# Patient Record
Sex: Female | Born: 1995
Health system: Southern US, Community
[De-identification: ages and names within clinical notes are randomized; demographics above are authoritative.]

## PROBLEM LIST (undated history)

## (undated) DIAGNOSIS — N63 Unspecified lump in unspecified breast: Secondary | ICD-10-CM

## (undated) DIAGNOSIS — L309 Dermatitis, unspecified: Secondary | ICD-10-CM

## (undated) DIAGNOSIS — Z9109 Other allergy status, other than to drugs and biological substances: Secondary | ICD-10-CM

## (undated) DIAGNOSIS — J45909 Unspecified asthma, uncomplicated: Secondary | ICD-10-CM

## (undated) HISTORY — DX: Unspecified asthma, uncomplicated: J45.909

## (undated) HISTORY — DX: Other allergy status, other than to drugs and biological substances: Z91.09

---
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
## 2015-01-28 ENCOUNTER — Encounter: Payer: Self-pay | Admitting: Internal Medicine

## 2015-01-28 ENCOUNTER — Ambulatory Visit (INDEPENDENT_AMBULATORY_CARE_PROVIDER_SITE_OTHER): Payer: 59 | Admitting: Internal Medicine

## 2015-01-28 VITALS — BP 116/76 | HR 82 | Temp 97.8°F | Resp 18 | Ht 67.0 in | Wt 141.0 lb

## 2015-01-28 DIAGNOSIS — Z1329 Encounter for screening for other suspected endocrine disorder: Secondary | ICD-10-CM

## 2015-01-28 DIAGNOSIS — Z13220 Encounter for screening for lipoid disorders: Secondary | ICD-10-CM

## 2015-01-28 DIAGNOSIS — Z130 Encounter for screening for diseases of the blood and blood-forming organs and certain disorders involving the immune mechanism: Secondary | ICD-10-CM

## 2015-01-28 DIAGNOSIS — E559 Vitamin D deficiency, unspecified: Secondary | ICD-10-CM

## 2015-01-28 DIAGNOSIS — Z0000 Encounter for general adult medical examination without abnormal findings: Secondary | ICD-10-CM | POA: Diagnosis not present

## 2015-01-28 DIAGNOSIS — Z1389 Encounter for screening for other disorder: Secondary | ICD-10-CM

## 2015-01-28 DIAGNOSIS — Z131 Encounter for screening for diabetes mellitus: Secondary | ICD-10-CM

## 2015-01-28 DIAGNOSIS — Z79899 Other long term (current) drug therapy: Secondary | ICD-10-CM

## 2015-01-28 DIAGNOSIS — N63 Unspecified lump in unspecified breast: Secondary | ICD-10-CM

## 2015-01-28 LAB — CBC WITH DIFFERENTIAL/PLATELET
Basophils Absolute: 0 10*3/uL (ref 0.0–0.1)
Basophils Relative: 0 % (ref 0–1)
Eosinophils Absolute: 0.8 10*3/uL — ABNORMAL HIGH (ref 0.0–0.7)
Eosinophils Relative: 12 % — ABNORMAL HIGH (ref 0–5)
HEMATOCRIT: 40.3 % (ref 36.0–46.0)
HEMOGLOBIN: 13.6 g/dL (ref 12.0–15.0)
LYMPHS ABS: 2 10*3/uL (ref 0.7–4.0)
Lymphocytes Relative: 29 % (ref 12–46)
MCH: 29 pg (ref 26.0–34.0)
MCHC: 33.7 g/dL (ref 30.0–36.0)
MCV: 85.9 fL (ref 78.0–100.0)
MONO ABS: 0.4 10*3/uL (ref 0.1–1.0)
MONOS PCT: 6 % (ref 3–12)
MPV: 11.2 fL (ref 8.6–12.4)
NEUTROS ABS: 3.7 10*3/uL (ref 1.7–7.7)
NEUTROS PCT: 53 % (ref 43–77)
Platelets: 277 10*3/uL (ref 150–400)
RBC: 4.69 MIL/uL (ref 3.87–5.11)
RDW: 14.1 % (ref 11.5–15.5)
WBC: 7 10*3/uL (ref 4.0–10.5)

## 2015-01-28 NOTE — Progress Notes (Signed)
Patient ID: Annette Zuniga, female   DOB: [birth year], 20 y.o.   MRN: [id number]    Annual Screening    This very nice 20 y.o.female presents for complete physical.  Patient has no major health issues.  Patient reports no complaints at this time.   Patient presents to the office to discuss a new breast mass that has been in her left breast x 1 year.  She reports that the mass can be painful during her period.  She reports that other times it is not painful.  She has never noticed any other spots in her breasts.    She reports that she started her period at age 20.  Periods are regular.  She has never taken any birth control pills or shots.  She is currently sexually active.  She uses condoms as birth control.    Finally, patient has history of Vitamin D Deficiency and last vitamin D was No results found for: VD25OH.  Currently on supplementation.  Patient reports that she does have some social anxiety and she feels like that makes it hard to talk to people.  She does find that she gets emotional but she does not have mood swings.  She does concentrate well in class.       No current outpatient prescriptions on file prior to visit.   No current facility-administered medications on file prior to visit.    Allergies not on file  No past medical history on file.   There is no immunization history on file for this patient.  No past surgical history on file.  No family history on file.  Social History   Social History  . Marital Status: Single    Spouse Name: N/A  . Number of Children: N/A  . Years of Education: N/A   Occupational History  . Not on file.   Social History Main Topics  . Smoking status: Never Smoker   . Smokeless tobacco: Not on file  . Alcohol Use: Not on file  . Drug Use: Not on file  . Sexual Activity: Not on file   Other Topics Concern  . Not on file   Social History Narrative  . No narrative on file    Review of Systems  Constitutional: Negative  for fever, chills and malaise/fatigue.  HENT: Positive for congestion. Negative for ear pain and sore throat.   Eyes: Negative.   Respiratory: Negative for cough, shortness of breath and wheezing.   Cardiovascular: Negative for chest pain, palpitations and leg swelling.  Gastrointestinal: Negative for heartburn, diarrhea, constipation, blood in stool and melena.  Genitourinary: Negative.   Neurological: Negative for dizziness, sensory change, loss of consciousness and headaches.  Psychiatric/Behavioral: Negative for depression. The patient is nervous/anxious. The patient does not have insomnia.       Physical Exam  BP 116/76 mmHg  Pulse 82  Temp(Src) 97.8 F (36.6 C) (Temporal)  Resp 18  Ht 5\' 7"  (1.702 m)  Wt 141 lb (63.957 kg)  BMI 22.08 kg/m2  LMP 01/28/2015  General Appearance: Well nourished and in no apparent distress. Eyes: PERRLA, EOMs, conjunctiva no swelling or erythema, normal fundi and vessels. Sinuses: No frontal/maxillary tenderness ENT/Mouth: EACs patent / TMs  nl. Nares clear without erythema, swelling, mucoid exudates. Oral hygiene is good. No erythema, swelling, or exudate. Tongue normal, non-obstructing. Tonsils not swollen or erythematous. Hearing normal.  Neck: Supple, thyroid normal. No bruits, nodes or JVD. Respiratory: Respiratory effort normal.  BS equal and clear bilateral without rales,  rhonci, wheezing or stridor. Cardio: Heart sounds are normal with regular rate and rhythm and no murmurs, rubs or gallops. Peripheral pulses are normal and equal bilaterally without edema. No aortic or femoral bruits. Chest: symmetric with normal excursions and percussion. Breasts: Symmetric,with lump in the left breast approximately 1 in away from the aereola.  Mass is firm, freely mobile, and mildly tender to palpation, no nipple discharge, retractions, or fibrocystic changes.  Abdomen: Flat, soft, with bowl sounds. Nontender, no guarding, rebound, hernias, masses, or  organomegaly.  Lymphatics: Non tender without lymphadenopathy.  Musculoskeletal: Full ROM all peripheral extremities, joint stability, 5/5 strength, and normal gait. Skin: Warm and dry without rashes, lesions, cyanosis, clubbing or  ecchymosis.  Neuro: Cranial nerves intact, reflexes equal bilaterally. Normal muscle tone, no cerebellar symptoms. Sensation intact.  Pysch: Awake and oriented X 3, normal affect, Insight and Judgment appropriate.   Assessment and Plan   1. Routine general medical examination at a health care facility   2. Breast mass in female -palpable in the breast at approximately 11 oclock -ultrasound report shows Birads category 4 -will order diagnostic mammogram -referral to general surgery for likely biopsy of breast  3. Screening for diabetes mellitus  - Hemoglobin A1c - Insulin, random  4. Screening for hyperlipidemia  - Lipid panel  5. Screening for thyroid disorder  - TSH  6. Screening for hematuria or proteinuria  - Urinalysis, Routine w reflex microscopic (not at [facility]) - Microalbumin / creatinine urine ratio  7. Vitamin D deficiency  - VITAMIN D 25 Hydroxy (Vit-D Deficiency, Fractures)  8. Medication management  - CBC with Differential/Platelet - BASIC METABOLIC PANEL WITH GFR - Hepatic function panel - Magnesium  9. Screening for deficiency anemia  - Iron and TIBC - Vitamin B12  Continue prudent diet as discussed, weight control, regular exercise, and medications. Routine screening labs and tests as requested with regular follow-up as recommended.  Over 40 minutes of exam, counseling, chart review and critical decision making was performed

## 2015-01-28 NOTE — Patient Instructions (Signed)
Preventive Care for Adults A healthy lifestyle and preventive care can promote health and wellness. Preventive health guidelines for women include the following key practices.  A routine yearly physical is a good way to check with your health care provider about your health and preventive screening. It is a chance to share any concerns and updates on your health and to receive a thorough exam.  Visit your dentist for a routine exam and preventive care every 6 months. Brush your teeth twice a day and floss once a day. Good oral hygiene prevents tooth decay and gum disease.  The frequency of eye exams is based on your age, health, family medical history, use of contact lenses, and other factors. Follow your health care provider's recommendations for frequency of eye exams.  Eat a healthy diet. Foods like vegetables, fruits, whole grains, low-fat dairy products, and lean protein foods contain the nutrients you need without too many calories. Decrease your intake of foods high in solid fats, added sugars, and salt. Eat the right amount of calories for you.Get information about a proper diet from your health care provider, if necessary.  Regular physical exercise is one of the most important things you can do for your health. Most adults should get at least 150 minutes of moderate-intensity exercise (any activity that increases your heart rate and causes you to sweat) each week. In addition, most adults need muscle-strengthening exercises on 2 or more days a week.  Maintain a healthy weight. The body mass index (BMI) is a screening tool to identify possible weight problems. It provides an estimate of body fat based on height and weight. Your health care provider can find your BMI and can help you achieve or maintain a healthy weight.For adults 20 years and older:  A BMI below 18.5 is considered underweight.  A BMI of 18.5 to 24.9 is normal.  A BMI of 25 to 29.9 is considered overweight.  A BMI of  30 and above is considered obese.  Maintain normal blood lipids and cholesterol levels by exercising and minimizing your intake of saturated fat. Eat a balanced diet with plenty of fruit and vegetables. Blood tests for lipids and cholesterol should begin at age 20 and be repeated every 5 years. If your lipid or cholesterol levels are high, you are over 50, or you are at high risk for heart disease, you may need your cholesterol levels checked more frequently.Ongoing high lipid and cholesterol levels should be treated with medicines if diet and exercise are not working.  If you smoke, find out from your health care provider how to quit. If you do not use tobacco, do not start.  Lung cancer screening is recommended for adults aged 55-80 years who are at high risk for developing lung cancer because of a history of smoking. A yearly low-dose CT scan of the lungs is recommended for people who have at least a 30-pack-year history of smoking and are a current smoker or have quit within the past 15 years. A pack year of smoking is smoking an average of 1 pack of cigarettes a day for 1 year (for example: 1 pack a day for 30 years or 2 packs a day for 15 years). Yearly screening should continue until the smoker has stopped smoking for at least 15 years. Yearly screening should be stopped for people who develop a health problem that would prevent them from having lung cancer treatment.  If you are pregnant, do not drink alcohol. If you are breastfeeding,   breastfeeding, be very cautious about drinking alcohol. If you are not pregnant and choose to drink alcohol, do not have more than 1 drink per day. One drink is considered to be 12 ounces (355 mL) of beer, 5 ounces (148 mL) of wine, or 1.5 ounces (44 mL) of liquor.  Avoid use of street drugs. Do not share needles with anyone. Ask for help if you need support or instructions about stopping the use of drugs.  High blood pressure causes heart disease and increases the risk of  stroke. Your blood pressure should be checked at least every 1 to 2 years. Ongoing high blood pressure should be treated with medicines if weight loss and exercise do not work.  If you are 3-31 years old, ask your health care provider if you should take aspirin to prevent strokes.  Diabetes screening involves taking a blood sample to check your fasting blood sugar level. This should be done once every 3 years, after age 31, if you are within normal weight and without risk factors for diabetes. Testing should be considered at a younger age or be carried out more frequently if you are overweight and have at least 1 risk factor for diabetes.  Breast cancer screening is essential preventive care for women. You should practice "breast self-awareness." This means understanding the normal appearance and feel of your breasts and may include breast self-examination. Any changes detected, no matter how small, should be reported to a health care provider. Women in their 76s and 30s should have a clinical breast exam (CBE) by a health care provider as part of a regular health exam every 1 to 3 years. After age 65, women should have a CBE every year. Starting at age 67, women should consider having a mammogram (breast X-ray test) every year. Women who have a family history of breast cancer should talk to their health care provider about genetic screening. Women at a high risk of breast cancer should talk to their health care providers about having an MRI and a mammogram every year.  Breast cancer gene (BRCA)-related cancer risk assessment is recommended for women who have family members with BRCA-related cancers. BRCA-related cancers include breast, ovarian, tubal, and peritoneal cancers. Having family members with these cancers may be associated with an increased risk for harmful changes (mutations) in the breast cancer genes BRCA1 and BRCA2. Results of the assessment will determine the need for genetic counseling and  BRCA1 and BRCA2 testing.  Routine pelvic exams to screen for cancer are no longer recommended for nonpregnant women who are considered low risk for cancer of the pelvic organs (ovaries, uterus, and vagina) and who do not have symptoms. Ask your health care provider if a screening pelvic exam is right for you.  If you have had past treatment for cervical cancer or a condition that could lead to cancer, you need Pap tests and screening for cancer for at least 20 years after your treatment. If Pap tests have been discontinued, your risk factors (such as having a new sexual partner) need to be reassessed to determine if screening should be resumed. Some women have medical problems that increase the chance of getting cervical cancer. In these cases, your health care provider may recommend more frequent screening and Pap tests.  The HPV test is an additional test that may be used for cervical cancer screening. The HPV test looks for the virus that can cause the cell changes on the cervix. The cells collected during the Pap test can  be tested for HPV. The HPV test could be used to screen women aged 98 years and older, and should be used in women of any age who have unclear Pap test results. After the age of 59, women should have HPV testing at the same frequency as a Pap test.  Colorectal cancer can be detected and often prevented. Most routine colorectal cancer screening begins at the age of 35 years and continues through age 61 years. However, your health care provider may recommend screening at an earlier age if you have risk factors for colon cancer. On a yearly basis, your health care provider may provide home test kits to check for hidden blood in the stool. Use of a small camera at the end of a tube, to directly examine the colon (sigmoidoscopy or colonoscopy), can detect the earliest forms of colorectal cancer. Talk to your health care provider about this at age 18, when routine screening begins. Direct  exam of the colon should be repeated every 5-10 years through age 67 years, unless early forms of pre-cancerous polyps or small growths are found.  People who are at an increased risk for hepatitis B should be screened for this virus. You are considered at high risk for hepatitis B if:  You were born in a country where hepatitis B occurs often. Talk with your health care provider about which countries are considered high risk.  Your parents were born in a high-risk country and you have not received a shot to protect against hepatitis B (hepatitis B vaccine).  You have HIV or AIDS.  You use needles to inject street drugs.  You live with, or have sex with, someone who has hepatitis B.  You get hemodialysis treatment.  You take certain medicines for conditions like cancer, organ transplantation, and autoimmune conditions.  Hepatitis C blood testing is recommended for all people born from [birth year] through 1965 and any individual with known risks for hepatitis C.  Practice safe sex. Use condoms and avoid high-risk sexual practices to reduce the spread of sexually transmitted infections (STIs). STIs include gonorrhea, chlamydia, syphilis, trichomonas, herpes, HPV, and human immunodeficiency virus (HIV). Herpes, HIV, and HPV are viral illnesses that have no cure. They can result in disability, cancer, and death.  You should be screened for sexually transmitted illnesses (STIs) including gonorrhea and chlamydia if:  You are sexually active and are younger than 24 years.  You are older than 24 years and your health care provider tells you that you are at risk for this type of infection.  Your sexual activity has changed since you were last screened and you are at an increased risk for chlamydia or gonorrhea. Ask your health care provider if you are at risk.  If you are at risk of being infected with HIV, it is recommended that you take a prescription medicine daily to prevent HIV infection. This is  called preexposure prophylaxis (PrEP). You are considered at risk if:  You are a heterosexual woman, are sexually active, and are at increased risk for HIV infection.  You take drugs by injection.  You are sexually active with a partner who has HIV.  Talk with your health care provider about whether you are at high risk of being infected with HIV. If you choose to begin PrEP, you should first be tested for HIV. You should then be tested every 3 months for as long as you are taking PrEP.  Osteoporosis is a disease in which the bones lose minerals and  strength with aging. This can result in serious bone fractures or breaks. The risk of osteoporosis can be identified using a bone density scan. Women ages 48 years and over and women at risk for fractures or osteoporosis should discuss screening with their health care providers. Ask your health care provider whether you should take a calcium supplement or vitamin D to reduce the rate of osteoporosis.  Menopause can be associated with physical symptoms and risks. Hormone replacement therapy is available to decrease symptoms and risks. You should talk to your health care provider about whether hormone replacement therapy is right for you.  Use sunscreen. Apply sunscreen liberally and repeatedly throughout the day. You should seek shade when your shadow is shorter than you. Protect yourself by wearing long sleeves, pants, a wide-brimmed hat, and sunglasses year round, whenever you are outdoors.  Once a month, do a whole body skin exam, using a mirror to look at the skin on your back. Tell your health care provider of new moles, moles that have irregular borders, moles that are larger than a pencil eraser, or moles that have changed in shape or color.  Stay current with required vaccines (immunizations).  Influenza vaccine. All adults should be immunized every year.  Tetanus, diphtheria, and acellular pertussis (Td, Tdap) vaccine. Pregnant women should  receive 1 dose of Tdap vaccine during each pregnancy. The dose should be obtained regardless of the length of time since the last dose. Immunization is preferred during the 27th-36th week of gestation. An adult who has not previously received Tdap or who does not know her vaccine status should receive 1 dose of Tdap. This initial dose should be followed by tetanus and diphtheria toxoids (Td) booster doses every 10 years. Adults with an unknown or incomplete history of completing a 3-dose immunization series with Td-containing vaccines should begin or complete a primary immunization series including a Tdap dose. Adults should receive a Td booster every 10 years.  Varicella vaccine. An adult without evidence of immunity to varicella should receive 2 doses or a second dose if she has previously received 1 dose. Pregnant females who do not have evidence of immunity should receive the first dose after pregnancy. This first dose should be obtained before leaving the health care facility. The second dose should be obtained 4-8 weeks after the first dose.  Human papillomavirus (HPV) vaccine. Females aged 13-26 years who have not received the vaccine previously should obtain the 3-dose series. The vaccine is not recommended for use in pregnant females. However, pregnancy testing is not needed before receiving a dose. If a female is found to be pregnant after receiving a dose, no treatment is needed. In that case, the remaining doses should be delayed until after the pregnancy. Immunization is recommended for any person with an immunocompromised condition through the age of 48 years if she did not get any or all doses earlier. During the 3-dose series, the second dose should be obtained 4-8 weeks after the first dose. The third dose should be obtained 24 weeks after the first dose and 16 weeks after the second dose.  Zoster vaccine. One dose is recommended for adults aged 67 years or older unless certain conditions are  present.  Measles, mumps, and rubella (MMR) vaccine. Adults born before [birth year] generally are considered immune to measles and mumps. Adults born in [birth year] or later should have 1 or more doses of MMR vaccine unless there is a contraindication to the vaccine or there is laboratory evidence of immunity  to each of the three diseases. A routine second dose of MMR vaccine should be obtained at least 28 days after the first dose for students attending postsecondary schools, health care workers, or international travelers. People who received inactivated measles vaccine or an unknown type of measles vaccine during 1963-1967 should receive 2 doses of MMR vaccine. People who received inactivated mumps vaccine or an unknown type of mumps vaccine before 1979 and are at high risk for mumps infection should consider immunization with 2 doses of MMR vaccine. For females of childbearing age, rubella immunity should be determined. If there is no evidence of immunity, females who are not pregnant should be vaccinated. If there is no evidence of immunity, females who are pregnant should delay immunization until after pregnancy. Unvaccinated health care workers born before [birth year] who lack laboratory evidence of measles, mumps, or rubella immunity or laboratory confirmation of disease should consider measles and mumps immunization with 2 doses of MMR vaccine or rubella immunization with 1 dose of MMR vaccine.  Pneumococcal 13-valent conjugate (PCV13) vaccine. When indicated, a person who is uncertain of her immunization history and has no record of immunization should receive the PCV13 vaccine. An adult aged 58 years or older who has certain medical conditions and has not been previously immunized should receive 1 dose of PCV13 vaccine. This PCV13 should be followed with a dose of pneumococcal polysaccharide (PPSV23) vaccine. The PPSV23 vaccine dose should be obtained at least 8 weeks after the dose of PCV13 vaccine. An adult aged 65  years or older who has certain medical conditions and previously received 1 or more doses of PPSV23 vaccine should receive 1 dose of PCV13. The PCV13 vaccine dose should be obtained 1 or more years after the last PPSV23 vaccine dose.  Pneumococcal polysaccharide (PPSV23) vaccine. When PCV13 is also indicated, PCV13 should be obtained first. All adults aged 18 years and older should be immunized. An adult younger than age 74 years who has certain medical conditions should be immunized. Any person who resides in a nursing home or long-term care facility should be immunized. An adult smoker should be immunized. People with an immunocompromised condition and certain other conditions should receive both PCV13 and PPSV23 vaccines. People with human immunodeficiency virus (HIV) infection should be immunized as soon as possible after diagnosis. Immunization during chemotherapy or radiation therapy should be avoided. Routine use of PPSV23 vaccine is not recommended for American Indians, [place] Natives, or people younger than 65 years unless there are medical conditions that require PPSV23 vaccine. When indicated, people who have unknown immunization and have no record of immunization should receive PPSV23 vaccine. One-time revaccination 5 years after the first dose of PPSV23 is recommended for people aged 19-64 years who have chronic kidney failure, nephrotic syndrome, asplenia, or immunocompromised conditions. People who received 1-2 doses of PPSV23 before age 21 years should receive another dose of PPSV23 vaccine at age 42 years or later if at least 5 years have passed since the previous dose. Doses of PPSV23 are not needed for people immunized with PPSV23 at or after age 61 years.  Meningococcal vaccine. Adults with asplenia or persistent complement component deficiencies should receive 2 doses of quadrivalent meningococcal conjugate (MenACWY-D) vaccine. The doses should be obtained at least 2 months apart.  Microbiologists working with certain meningococcal bacteria, [employer], people at risk during an outbreak, and people who travel to or live in countries with a high rate of meningitis should be immunized. A first-year college student up through  age 23 years who is living in a residence hall should receive a dose if she did not receive a dose on or after her 16th birthday. Adults who have certain high-risk conditions should receive one or more doses of vaccine.  Hepatitis A vaccine. Adults who wish to be protected from this disease, have certain high-risk conditions, work with hepatitis A-infected animals, work in hepatitis A research labs, or travel to or work in countries with a high rate of hepatitis A should be immunized. Adults who were previously unvaccinated and who anticipate close contact with an international adoptee during the first 60 days after arrival in the [place] from a country with a high rate of hepatitis A should be immunized.  Hepatitis B vaccine. Adults who wish to be protected from this disease, have certain high-risk conditions, may be exposed to blood or other infectious body fluids, are household contacts or sex partners of hepatitis B positive people, are clients or workers in certain care facilities, or travel to or work in countries with a high rate of hepatitis B should be immunized.  Haemophilus influenzae type b (Hib) vaccine. A previously unvaccinated person with asplenia or sickle cell disease or having a scheduled splenectomy should receive 1 dose of Hib vaccine. Regardless of previous immunization, a recipient of a hematopoietic stem cell transplant should receive a 3-dose series 6-12 months after her successful transplant. Hib vaccine is not recommended for adults with HIV infection. Preventive Services / Frequency  Ages 65 to 22 years  Blood pressure check.  Lipid and cholesterol check.  Clinical breast exam.** / Every 3 years for women in their 53s  and 35s.  BRCA-related cancer risk assessment.** / For women who have family members with a BRCA-related cancer (breast, ovarian, tubal, or peritoneal cancers).  Pap test.** / Every 2 years from ages 41 through 44. Every 3 years starting at age 64 through age 28 or 21 with a history of 3 consecutive normal Pap tests.  HPV screening.** / Every 3 years from ages 9 through ages 56 to 48 with a history of 3 consecutive normal Pap tests.  Hepatitis C blood test.** / For any individual with known risks for hepatitis C.  Skin self-exam. / Monthly.  Influenza vaccine. / Every year.  Tetanus, diphtheria, and acellular pertussis (Tdap, Td) vaccine.** / Consult your health care provider. Pregnant women should receive 1 dose of Tdap vaccine during each pregnancy. 1 dose of Td every 10 years.  Varicella vaccine.** / Consult your health care provider. Pregnant females who do not have evidence of immunity should receive the first dose after pregnancy.  HPV vaccine. / 3 doses over 6 months, if 67 and younger. The vaccine is not recommended for use in pregnant females. However, pregnancy testing is not needed before receiving a dose.  Measles, mumps, rubella (MMR) vaccine.** / You need at least 1 dose of MMR if you were born in 1957 or later. You may also need a 2nd dose. For females of childbearing age, rubella immunity should be determined. If there is no evidence of immunity, females who are not pregnant should be vaccinated. If there is no evidence of immunity, females who are pregnant should delay immunization until after pregnancy.  Pneumococcal 13-valent conjugate (PCV13) vaccine.** / Consult your health care provider.  Pneumococcal polysaccharide (PPSV23) vaccine.** / 1 to 2 doses if you smoke cigarettes or if you have certain conditions.  Meningococcal vaccine.** / 1 dose if you are age 61 to 21 years and  first-year college student living in a residence hall, or have one of several medical  conditions, you need to get vaccinated against meningococcal disease. You may also need additional booster doses.  Hepatitis A vaccine.** / Consult your health care provider.  Hepatitis B vaccine.** / Consult your health care provider.  Haemophilus influenzae type b (Hib) vaccine.** / Consult your health care provider.   

## 2015-01-29 LAB — BASIC METABOLIC PANEL WITH GFR
BUN: 7 mg/dL (ref 7–20)
CALCIUM: 9.5 mg/dL (ref 8.9–10.4)
CHLORIDE: 102 mmol/L (ref 98–110)
CO2: 26 mmol/L (ref 20–31)
CREATININE: 0.54 mg/dL (ref 0.50–1.00)
GFR, Est African American: >89 mL/min (ref >=60)
GFR, Est Non African American: >89 mL/min (ref >=60)
GLUCOSE: 84 mg/dL (ref 65–99)
Potassium: 4 mmol/L (ref 3.8–5.1)
Sodium: 138 mmol/L (ref 135–146)

## 2015-01-29 LAB — LIPID PANEL
CHOL/HDL RATIO: 2.6 ratio (ref <=5.0)
Cholesterol: 144 mg/dL (ref 125–170)
HDL: 56 mg/dL (ref 36–76)
LDL Cholesterol: 77 mg/dL (ref <110)
Triglycerides: 54 mg/dL (ref 40–136)
VLDL: 11 mg/dL (ref <30)

## 2015-01-29 LAB — HEPATIC FUNCTION PANEL
ALBUMIN: 4.7 g/dL (ref 3.6–5.1)
ALT: 8 U/L (ref 5–32)
AST: 13 U/L (ref 12–32)
Alkaline Phosphatase: 59 U/L (ref 47–176)
BILIRUBIN DIRECT: 0.2 mg/dL (ref <=0.2)
Indirect Bilirubin: 1 mg/dL (ref 0.2–1.1)
TOTAL PROTEIN: 7.4 g/dL (ref 6.3–8.2)
Total Bilirubin: 1.2 mg/dL — ABNORMAL HIGH (ref 0.2–1.1)

## 2015-01-29 LAB — URINALYSIS, ROUTINE W REFLEX MICROSCOPIC
BILIRUBIN URINE: NEGATIVE
Glucose, UA: NEGATIVE
Leukocytes, UA: NEGATIVE
Nitrite: NEGATIVE
PH: 6 (ref 5.0–8.0)
SPECIFIC GRAVITY, URINE: 1.029 (ref 1.001–1.035)

## 2015-01-29 LAB — INSULIN, RANDOM: INSULIN: 6.3 u[IU]/mL (ref 2.0–19.6)

## 2015-01-29 LAB — IRON AND TIBC
%SAT: 21 % (ref 8–45)
Iron: 72 ug/dL (ref 27–164)
TIBC: 336 ug/dL (ref 271–448)
UIBC: 264 ug/dL (ref 125–400)

## 2015-01-29 LAB — MAGNESIUM: MAGNESIUM: 2 mg/dL (ref 1.5–2.5)

## 2015-01-29 LAB — URINALYSIS, MICROSCOPIC ONLY
BACTERIA UA: NONE SEEN [HPF]
Casts: NONE SEEN [LPF]
Crystals: NONE SEEN [HPF]
RBC / HPF: >60 RBC/HPF — AB (ref <=2)
YEAST: NONE SEEN [HPF]

## 2015-01-29 LAB — MICROALBUMIN / CREATININE URINE RATIO
CREATININE, URINE: 499 mg/dL — AB (ref 20–320)
MICROALB UR: 4.1 mg/dL
Microalb Creat Ratio: 8 mcg/mg creat (ref <30)

## 2015-01-29 LAB — VITAMIN D 25 HYDROXY (VIT D DEFICIENCY, FRACTURES): VIT D 25 HYDROXY: 12 ng/mL — AB (ref 30–100)

## 2015-01-29 LAB — HEMOGLOBIN A1C
HEMOGLOBIN A1C: 5.1 % (ref <5.7)
MEAN PLASMA GLUCOSE: 100 mg/dL (ref <117)

## 2015-01-29 LAB — VITAMIN B12: Vitamin B-12: 622 pg/mL (ref 211–911)

## 2015-01-29 LAB — TSH: TSH: 0.987 u[IU]/mL (ref 0.350–4.500)

## 2015-01-30 ENCOUNTER — Encounter: Payer: Self-pay | Admitting: Internal Medicine

## 2015-02-14 ENCOUNTER — Other Ambulatory Visit: Payer: Self-pay | Admitting: Internal Medicine

## 2015-02-14 DIAGNOSIS — N6322 Unspecified lump in the left breast, upper inner quadrant: Secondary | ICD-10-CM

## 2015-02-25 ENCOUNTER — Other Ambulatory Visit: Payer: Self-pay | Admitting: General Surgery

## 2015-02-28 ENCOUNTER — Ambulatory Visit: Payer: Self-pay | Admitting: Internal Medicine

## 2015-03-14 ENCOUNTER — Other Ambulatory Visit (HOSPITAL_COMMUNITY): Payer: Self-pay

## 2015-03-14 ENCOUNTER — Encounter (HOSPITAL_COMMUNITY): Payer: Self-pay

## 2015-03-14 NOTE — Patient Instructions (Addendum)
YOUR PROCEDURE IS SCHEDULED ON : 03/18/15  REPORT TO [facility] MAIN ENTRANCE FOLLOW SIGNS TO EAST ELEVATOR - GO TO 3rd FLOOR CHECK IN AT 3 EAST NURSES STATION (SHORT STAY) AT:  5:30 AM  CALL THIS NUMBER IF YOU HAVE PROBLEMS THE MORNING OF SURGERY [phone]  REMEMBER:ONLY 1 PER PERSON MAY GO TO SHORT STAY WITH YOU TO GET READY THE MORNING OF YOUR SURGERY  DO NOT EAT FOOD OR DRINK LIQUIDS AFTER MIDNIGHT  TAKE THESE MEDICINES THE MORNING OF SURGERY: NONE  STOP ASPIRIN / IBUPROFEN / ALEVE / VITAMINS / HERBAL MEDS __5__ DAYS BEFORE SURGERY  YOU MAY NOT HAVE ANY METAL ON YOUR BODY INCLUDING HAIR PINS AND PIERCING'S. DO NOT WEAR JEWELRY, MAKEUP, LOTIONS, POWDERS OR PERFUMES. DO NOT WEAR NAIL POLISH. DO NOT SHAVE 48 HRS PRIOR TO SURGERY. MEN MAY SHAVE FACE AND NECK.  DO NOT [facility]. [facility] IS NOT RESPONSIBLE FOR VALUABLES.  CONTACTS, DENTURES OR PARTIALS MAY NOT BE WORN TO SURGERY. LEAVE SUITCASE IN CAR. CAN BE BROUGHT TO ROOM AFTER SURGERY.  PATIENTS DISCHARGED THE DAY OF SURGERY WILL NOT BE ALLOWED TO DRIVE HOME.  PLEASE READ OVER THE FOLLOWING INSTRUCTION SHEETS _________________________________________________________________________________                                          [facility] - PREPARING FOR SURGERY  Before surgery, you can play an important role.  Because skin is not sterile, your skin needs to be as free of germs as possible.  You can reduce the number of germs on your skin by washing with CHG (chlorahexidine gluconate) soap before surgery.  CHG is an antiseptic cleaner which kills germs and bonds with the skin to continue killing germs even after washing. Please DO NOT use if you have an allergy to CHG or antibacterial soaps.  If your skin becomes reddened/irritated stop using the CHG and inform your nurse when you arrive at Short Stay. Do not shave (including legs and underarms) for at least 48 hours prior to the first  CHG shower.  You may shave your face. Please follow these instructions carefully:   1.  Shower with CHG Soap the night before surgery and the  morning of Surgery.   2.  If you choose to wash your hair, wash your hair first as usual with your  normal  Shampoo.   3.  After you shampoo, rinse your hair and body thoroughly to remove the  shampoo.                                         4.  Use CHG as you would any other liquid soap.  You can apply chg directly  to the skin and wash . Gently wash with scrungie or clean wascloth    5.  Apply the CHG Soap to your body ONLY FROM THE NECK DOWN.   Do not use on open                           Wound or open sores. Avoid contact with eyes, ears mouth and genitals (private parts).  Genitals (private parts) with your normal soap.              6.  Wash thoroughly, paying special attention to the area where your surgery  will be performed.   7.  Thoroughly rinse your body with warm water from the neck down.   8.  DO NOT shower/wash with your normal soap after using and rinsing off  the CHG Soap .                9.  Pat yourself dry with a clean towel.             10.  Wear clean night clothes to bed after shower             11.  Place clean sheets on your bed the night of your first shower and do not  sleep with pets.  Day of Surgery : Do not apply any lotions/deodorants the morning of surgery.  Please wear clean clothes to the hospital/surgery center.  FAILURE TO FOLLOW THESE INSTRUCTIONS MAY RESULT IN THE CANCELLATION OF YOUR SURGERY    PATIENT SIGNATURE_________________________________  ______________________________________________________________________

## 2015-03-15 ENCOUNTER — Encounter (HOSPITAL_COMMUNITY): Payer: Self-pay

## 2015-03-15 ENCOUNTER — Encounter (INDEPENDENT_AMBULATORY_CARE_PROVIDER_SITE_OTHER): Payer: Self-pay

## 2015-03-15 ENCOUNTER — Encounter (HOSPITAL_COMMUNITY)
Admission: RE | Admit: 2015-03-15 | Discharge: 2015-03-15 | Disposition: A | Discharge status: Home / Self Care | Payer: 59 | Source: Ambulatory Visit | Attending: General Surgery | Admitting: General Surgery

## 2015-03-15 ENCOUNTER — Other Ambulatory Visit (HOSPITAL_COMMUNITY): Payer: 59

## 2015-03-15 DIAGNOSIS — Z79899 Other long term (current) drug therapy: Secondary | ICD-10-CM | POA: Diagnosis not present

## 2015-03-15 DIAGNOSIS — D242 Benign neoplasm of left breast: Secondary | ICD-10-CM | POA: Diagnosis not present

## 2015-03-15 DIAGNOSIS — N63 Unspecified lump in breast: Secondary | ICD-10-CM | POA: Diagnosis present

## 2015-03-15 HISTORY — DX: Unspecified lump in unspecified breast: N63.0

## 2015-03-15 HISTORY — DX: Dermatitis, unspecified: L30.9

## 2015-03-15 LAB — CBC
HEMATOCRIT: 39.9 % (ref 36.0–46.0)
HEMOGLOBIN: 13.2 g/dL (ref 12.0–15.0)
MCH: 28.9 pg (ref 26.0–34.0)
MCHC: 33.1 g/dL (ref 30.0–36.0)
MCV: 87.3 fL (ref 78.0–100.0)
Platelets: 253 10*3/uL (ref 150–400)
RBC: 4.57 MIL/uL (ref 3.87–5.11)
RDW: 12.4 % (ref 11.5–15.5)
WBC: 6.4 10*3/uL (ref 4.0–10.5)

## 2015-03-15 LAB — HCG, SERUM, QUALITATIVE: Preg, Serum: NEGATIVE

## 2015-03-18 ENCOUNTER — Other Ambulatory Visit: Payer: Self-pay | Admitting: Internal Medicine

## 2015-03-18 ENCOUNTER — Ambulatory Visit (HOSPITAL_COMMUNITY)
Admission: RE | Admit: 2015-03-18 | Discharge: 2015-03-18 | Disposition: A | Discharge status: Home / Self Care | Payer: 59 | Source: Ambulatory Visit | Attending: General Surgery | Admitting: General Surgery

## 2015-03-18 ENCOUNTER — Ambulatory Visit (HOSPITAL_COMMUNITY): Payer: 59 | Admitting: Registered Nurse

## 2015-03-18 ENCOUNTER — Encounter (HOSPITAL_COMMUNITY): Payer: Self-pay | Admitting: *Deleted

## 2015-03-18 ENCOUNTER — Encounter (HOSPITAL_COMMUNITY)
Admission: RE | Disposition: A | Discharge status: Home / Self Care | Payer: Self-pay | Source: Ambulatory Visit | Attending: General Surgery

## 2015-03-18 DIAGNOSIS — D242 Benign neoplasm of left breast: Secondary | ICD-10-CM | POA: Diagnosis not present

## 2015-03-18 DIAGNOSIS — Z79899 Other long term (current) drug therapy: Secondary | ICD-10-CM | POA: Insufficient documentation

## 2015-03-18 HISTORY — PX: BREAST LUMPECTOMY: SHX2

## 2015-03-18 SURGERY — BREAST LUMPECTOMY
Anesthesia: General | Site: Breast | Laterality: Left

## 2015-03-18 MED ORDER — MIDAZOLAM HCL 2 MG/2ML IJ SOLN
INTRAMUSCULAR | Status: AC
  Filled 2015-03-18: qty 2

## 2015-03-18 MED ORDER — CIPROFLOXACIN IN D5W 400 MG/200ML IV SOLN
400.0000 mg | INTRAVENOUS | Status: AC
  Administered 2015-03-18: 400 mg via INTRAVENOUS

## 2015-03-18 MED ORDER — BUPIVACAINE-EPINEPHRINE (PF) 0.25% -1:200000 IJ SOLN
INTRAMUSCULAR | Status: AC
  Filled 2015-03-18: qty 30

## 2015-03-18 MED ORDER — DEXAMETHASONE SODIUM PHOSPHATE 10 MG/ML IJ SOLN
INTRAMUSCULAR | Status: AC
  Filled 2015-03-18: qty 1

## 2015-03-18 MED ORDER — FENTANYL CITRATE (PF) 250 MCG/5ML IJ SOLN
INTRAMUSCULAR | Status: AC
  Filled 2015-03-18: qty 5

## 2015-03-18 MED ORDER — OXYCODONE HCL 5 MG PO TABS
5.0000 mg | ORAL_TABLET | Freq: Once | ORAL | Status: AC | PRN
  Administered 2015-03-18: 5 mg via ORAL

## 2015-03-18 MED ORDER — OXYCODONE HCL 5 MG PO TABS
5.0000 mg | ORAL_TABLET | ORAL | Status: DC | PRN
  Administered 2015-03-18: 5 mg via ORAL
  Filled 2015-03-18: qty 1

## 2015-03-18 MED ORDER — LIDOCAINE HCL 1 % IJ SOLN
INTRAMUSCULAR | Status: AC
  Filled 2015-03-18: qty 20

## 2015-03-18 MED ORDER — ACETAMINOPHEN 160 MG/5ML PO SOLN: 325.0000 mg | ORAL | Status: DC | PRN

## 2015-03-18 MED ORDER — FENTANYL CITRATE (PF) 100 MCG/2ML IJ SOLN
INTRAMUSCULAR | Status: DC | PRN
  Administered 2015-03-18: 50 ug via INTRAVENOUS
  Administered 2015-03-18: 25 ug via INTRAVENOUS

## 2015-03-18 MED ORDER — LACTATED RINGERS IV SOLN: INTRAVENOUS | Status: DC

## 2015-03-18 MED ORDER — CIPROFLOXACIN IN D5W 400 MG/200ML IV SOLN
INTRAVENOUS | Status: AC
  Filled 2015-03-18: qty 200

## 2015-03-18 MED ORDER — SODIUM CHLORIDE 0.9% FLUSH: 3.0000 mL | INTRAVENOUS | Status: DC | PRN

## 2015-03-18 MED ORDER — ONDANSETRON HCL 4 MG/2ML IJ SOLN
INTRAMUSCULAR | Status: AC
  Filled 2015-03-18: qty 2

## 2015-03-18 MED ORDER — SODIUM CHLORIDE 0.9 % IV SOLN: 250.0000 mL | INTRAVENOUS | Status: DC | PRN

## 2015-03-18 MED ORDER — LIDOCAINE HCL (CARDIAC) 20 MG/ML IV SOLN
INTRAVENOUS | Status: AC
Start: 2015-03-18 — End: 2015-03-18
  Filled 2015-03-18: qty 5

## 2015-03-18 MED ORDER — BUPIVACAINE-EPINEPHRINE (PF) 0.25% -1:200000 IJ SOLN
INTRAMUSCULAR | Status: DC | PRN
  Administered 2015-03-18: 09:00:00

## 2015-03-18 MED ORDER — SODIUM CHLORIDE 0.9 % IJ SOLN
INTRAMUSCULAR | Status: AC
  Filled 2015-03-18: qty 10

## 2015-03-18 MED ORDER — SODIUM CHLORIDE 0.9% FLUSH: 3.0000 mL | Freq: Two times a day (BID) | INTRAVENOUS | Status: DC

## 2015-03-18 MED ORDER — DEXAMETHASONE SODIUM PHOSPHATE 10 MG/ML IJ SOLN
INTRAMUSCULAR | Status: DC | PRN
  Administered 2015-03-18: 10 mg via INTRAVENOUS

## 2015-03-18 MED ORDER — OXYCODONE HCL 5 MG/5ML PO SOLN
5.0000 mg | Freq: Once | ORAL | Status: AC | PRN
  Filled 2015-03-18: qty 5

## 2015-03-18 MED ORDER — ACETAMINOPHEN 325 MG PO TABS: 325.0000 mg | ORAL_TABLET | ORAL | Status: DC | PRN

## 2015-03-18 MED ORDER — EPHEDRINE SULFATE 50 MG/ML IJ SOLN
INTRAMUSCULAR | Status: DC | PRN
  Administered 2015-03-18 (×3): 5 mg via INTRAVENOUS

## 2015-03-18 MED ORDER — ACETAMINOPHEN 325 MG PO TABS: 650.0000 mg | ORAL_TABLET | ORAL | Status: DC | PRN

## 2015-03-18 MED ORDER — LIDOCAINE HCL (CARDIAC) 20 MG/ML IV SOLN
INTRAVENOUS | Status: DC | PRN
  Administered 2015-03-18: 80 mg via INTRAVENOUS

## 2015-03-18 MED ORDER — PROPOFOL 10 MG/ML IV BOLUS
INTRAVENOUS | Status: AC
  Filled 2015-03-18: qty 20

## 2015-03-18 MED ORDER — ACETAMINOPHEN 650 MG RE SUPP
650.0000 mg | RECTAL | Status: DC | PRN
  Filled 2015-03-18: qty 1

## 2015-03-18 MED ORDER — OXYCODONE-ACETAMINOPHEN 5-325 MG PO TABS: 1.0000 | ORAL_TABLET | ORAL | Status: DC | PRN

## 2015-03-18 MED ORDER — EPHEDRINE SULFATE 50 MG/ML IJ SOLN
INTRAMUSCULAR | Status: AC
  Filled 2015-03-18: qty 1

## 2015-03-18 MED ORDER — ONDANSETRON HCL 4 MG/2ML IJ SOLN
INTRAMUSCULAR | Status: DC | PRN
  Administered 2015-03-18: 4 mg via INTRAVENOUS

## 2015-03-18 MED ORDER — MIDAZOLAM HCL 5 MG/5ML IJ SOLN
INTRAMUSCULAR | Status: DC | PRN
  Administered 2015-03-18 (×2): 1 mg via INTRAVENOUS

## 2015-03-18 MED ORDER — FENTANYL CITRATE (PF) 100 MCG/2ML IJ SOLN: 25.0000 ug | INTRAMUSCULAR | Status: DC | PRN

## 2015-03-18 MED ORDER — LACTATED RINGERS IV SOLN
INTRAVENOUS | Status: DC | PRN
  Administered 2015-03-18: 07:00:00 via INTRAVENOUS

## 2015-03-18 MED ORDER — PROPOFOL 10 MG/ML IV BOLUS
INTRAVENOUS | Status: DC | PRN
  Administered 2015-03-18: 150 mg via INTRAVENOUS

## 2015-03-18 MED FILL — OXYCODONE/APAP 5/325MG: 5-325 | 3 days supply | Qty: 30 | Fill #0

## 2015-03-18 SURGICAL SUPPLY — 40 items
BINDER BREAST LRG (GAUZE/BANDAGES/DRESSINGS) ×2 IMPLANT
BLADE HEX COATED 2.75 (ELECTRODE) IMPLANT
BLADE SURG 15 STRL LF DISP TIS (BLADE) ×2 IMPLANT
BLADE SURG 15 STRL SS (BLADE) ×2
BLADE SURG SZ10 CARB STEEL (BLADE) IMPLANT
CHLORAPREP W/TINT 26ML (MISCELLANEOUS) ×2 IMPLANT
COVER SURGICAL LIGHT HANDLE (MISCELLANEOUS) ×2 IMPLANT
DECANTER SPIKE VIAL GLASS SM (MISCELLANEOUS) IMPLANT
DERMABOND ADVANCED (GAUZE/BANDAGES/DRESSINGS) ×1
DERMABOND ADVANCED .7 DNX12 (GAUZE/BANDAGES/DRESSINGS) ×1 IMPLANT
DRAPE LAPAROTOMY TRNSV 102X78 (DRAPE) ×2 IMPLANT
DRSG PAD ABDOMINAL 8X10 ST (GAUZE/BANDAGES/DRESSINGS) ×2 IMPLANT
ELECT PENCIL ROCKER SW 15FT (MISCELLANEOUS) ×2 IMPLANT
ELECT REM PT RETURN 9FT ADLT (ELECTROSURGICAL) ×2
ELECTRODE REM PT RTRN 9FT ADLT (ELECTROSURGICAL) ×1 IMPLANT
GAUZE SPONGE 4X4 12PLY STRL (GAUZE/BANDAGES/DRESSINGS) ×2 IMPLANT
GAUZE SPONGE 4X4 16PLY XRAY LF (GAUZE/BANDAGES/DRESSINGS) ×2 IMPLANT
GLOVE BIOGEL PI IND STRL 7.0 (GLOVE) ×1 IMPLANT
GLOVE BIOGEL PI INDICATOR 7.0 (GLOVE) ×1
GLOVE ECLIPSE 8.0 STRL XLNG CF (GLOVE) ×2 IMPLANT
GLOVE INDICATOR 8.0 STRL GRN (GLOVE) ×4 IMPLANT
GOWN STRL REUS W/ TWL XL LVL3 (GOWN DISPOSABLE) ×3 IMPLANT
GOWN STRL REUS W/TWL LRG LVL3 (GOWN DISPOSABLE) ×2 IMPLANT
GOWN STRL REUS W/TWL XL LVL3 (GOWN DISPOSABLE) ×3
KIT BASIN OR (CUSTOM PROCEDURE TRAY) ×2 IMPLANT
LIGHT WAVEGUIDE WIDE FLAT (MISCELLANEOUS) ×2 IMPLANT
MARKER SKIN DUAL TIP RULER LAB (MISCELLANEOUS) ×2 IMPLANT
NEEDLE HYPO 22GX1.5 SAFETY (NEEDLE) IMPLANT
NEEDLE HYPO 25X1 1.5 SAFETY (NEEDLE) IMPLANT
NS IRRIG 1000ML POUR BTL (IV SOLUTION) ×2 IMPLANT
PACK BASIC VI WITH GOWN DISP (CUSTOM PROCEDURE TRAY) ×2 IMPLANT
SOL PREP POV-IOD 4OZ 10% (MISCELLANEOUS) ×2 IMPLANT
STRIP CLOSURE SKIN 1/2X4 (GAUZE/BANDAGES/DRESSINGS) ×2 IMPLANT
SUT MNCRL AB 4-0 PS2 18 (SUTURE) ×2 IMPLANT
SUT VIC AB 3-0 SH 27 (SUTURE) ×3
SUT VIC AB 3-0 SH 27XBRD (SUTURE) ×3 IMPLANT
SYR BULB IRRIGATION 50ML (SYRINGE) ×2 IMPLANT
SYR CONTROL 10ML LL (SYRINGE) IMPLANT
TOWEL OR 17X26 10 PK STRL BLUE (TOWEL DISPOSABLE) ×2 IMPLANT
YANKAUER SUCT BULB TIP 10FT TU (MISCELLANEOUS) ×2 IMPLANT

## 2015-03-18 NOTE — Op Note (Signed)
Excisional Breast Biopsy  Indications: This patient presents with history of left breast mass, probable fibroadenoma, enlarging.  Pre-operative Diagnosis: left breast mass  Post-operative Diagnosis: left breast mass  Surgeon: Stark Klein   Anesthesia: General LMA anesthesia and Local anesthesia 1% plain lidocaine, 0.25.% bupivacaine, with epinephrine  ASA Class: 1  Procedure Details  The patient was seen in the Holding Room. The risks, benefits, complications, treatment options, and expected outcomes were discussed with the patient. The possibilities of reaction to medication, pulmonary aspiration, bleeding, infection, the need for additional procedures, failure to diagnose a condition, and creating a complication requiring transfusion or operation were discussed with the patient. The patient concurred with the proposed plan, giving informed consent.  The site of surgery properly noted/marked. The patient was taken to Operating Room # 6, identified, and the procedure verified as Breast Excisional Biopsy. A Time Out was held and the above information confirmed.  After induction of anesthesia, the left  breast and chest were prepped and draped in standard fashion. The lumpectomy was performed by creating an oblique incision over the upper inner quadrant of the breast.  Dissection was carried down around the mass. Hemostasis was achieved with cautery.  The wound was irrigated and closed with a 3-0 Vicryl interrupted deep dermal stitch and a 4-0 Monocryl subcuticular closure in layers.    Sterile dressings were applied. At the end of the operation, all sponge, instrument, and needle counts were correct.  Findings: grossly clear surgical margins.  Very dense breast tissue as expected  Estimated Blood Loss:  Minimal             Specimens: left breast mass         Complications:  None; patient tolerated the procedure well.         Disposition: PACU - hemodynamically stable.          Condition: stable

## 2015-03-18 NOTE — Discharge Instructions (Signed)
[facility] °Office Phone Number [phone] ° °BREAST BIOPSY/ PARTIAL MASTECTOMY: POST OP INSTRUCTIONS ° °Always review your discharge instruction sheet given to you by the facility where your surgery was performed. ° °IF YOU HAVE DISABILITY OR FAMILY LEAVE FORMS, YOU MUST BRING THEM TO THE OFFICE FOR PROCESSING.  DO NOT GIVE THEM TO YOUR DOCTOR. ° °1. A prescription for pain medication may be given to you upon discharge.  Take your pain medication as prescribed, if needed.  If narcotic pain medicine is not needed, then you may take acetaminophen (Tylenol) or ibuprofen (Advil) as needed. °2. Take your usually prescribed medications unless otherwise directed °3. If you need a refill on your pain medication, please contact your pharmacy.  They will contact our office to request authorization.  Prescriptions will not be filled after 5pm or on week-ends. °4. You should eat very light the first 24 hours after surgery, such as soup, crackers, pudding, etc.  Resume your normal diet the day after surgery. °5. Most patients will experience some swelling and bruising in the breast.  Ice packs and a good support bra will help.  Swelling and bruising can take several days to resolve.  °6. It is common to experience some constipation if taking pain medication after surgery.  Increasing fluid intake and taking a stool softener will usually help or prevent this problem from occurring.  A mild laxative (Milk of Magnesia or Miralax) should be taken according to package directions if there are no bowel movements after 48 hours. °7. Unless discharge instructions indicate otherwise, you may remove your bandages 48 hours after surgery, and you may shower at that time.  You may have steri-strips (small skin tapes) in place directly over the incision.  These strips should be left on the skin for 7-10 days.   Any sutures or staples will be removed at the office during your follow-up visit. °8. ACTIVITIES:  You may resume  regular daily activities (gradually increasing) beginning the next day.  Wearing a good support bra or sports bra (or the breast binder) minimizes pain and swelling.  You may have sexual intercourse when it is comfortable. °a. You may drive when you no longer are taking prescription pain medication, you can comfortably wear a seatbelt, and you can safely maneuver your car and apply brakes. °b. RETURN TO WORK:  __________1 week_______________ °9. You should see your doctor in the office for a follow-up appointment approximately two weeks after your surgery.  Your doctor’s nurse will typically make your follow-up appointment when she calls you with your pathology report.  Expect your pathology report 2-3 business days after your surgery.  You may call to check if you do not hear from us after three days. ° ° °WHEN TO CALL YOUR DOCTOR: °1. Fever over 101.0 °2. Nausea and/or vomiting. °3. Extreme swelling or bruising. °4. Continued bleeding from incision. °5. Increased pain, redness, or drainage from the incision. ° °The clinic staff is available to answer your questions during regular business hours.  Please don’t hesitate to call and ask to speak to one of the nurses for clinical concerns.  If you have a medical emergency, go to the nearest emergency room or call 911.  A surgeon from [facility] is always on call at the hospital. ° °For further questions, please visit [email or web address]  ° °

## 2015-03-18 NOTE — Transfer of Care (Signed)
Immediate Anesthesia Transfer of Care Note  Patient: Annette Zuniga  Procedure(s) Performed: Procedure(s): EXCISION OF LEFT BREAST MASS (Left)  Patient Location: PACU  Anesthesia Type:General  Level of Consciousness: awake, alert , oriented and patient cooperative  Airway & Oxygen Therapy: Patient Spontanous Breathing and Patient connected to nasal cannula oxygen  Post-op Assessment: Report given to RN and Post -op Vital signs reviewed and stable  Post vital signs: Reviewed and stable  Last Vitals:  Filed Vitals:   03/18/15 0559  BP: 109/49  Pulse: 83  Temp: 36.6 C  Resp: 16    Complications: No apparent anesthesia complications

## 2015-03-18 NOTE — Anesthesia Postprocedure Evaluation (Signed)
Anesthesia Post Note  Patient: Annette Zuniga  Procedure(s) Performed: Procedure(s) (LRB): EXCISION OF LEFT BREAST MASS (Left)  Patient location during evaluation: PACU Anesthesia Type: General Level of consciousness: awake Pain management: pain level controlled Vital Signs Assessment: post-procedure vital signs reviewed and stable Respiratory status: spontaneous breathing Cardiovascular status: stable Postop Assessment: no signs of nausea or vomiting Anesthetic complications: no    Last Vitals:  Filed Vitals:   03/18/15 0953 03/18/15 1125  BP: 107/64 108/58  Pulse: 67 67  Temp: 36.2 C 36.2 C  Resp: 16 16    Last Pain:  Filed Vitals:   03/18/15 1346  PainSc: 0-No pain                 Valentine Barney

## 2015-03-18 NOTE — H&P (Signed)
Annette Zuniga is an 20 y.o. female.   Chief Complaint: left breast mass HPI: Patient isa  20 yo F who presents with a left breast mass that is painful.  Mammo/ultrasound is birads 4.    Past Medical History  Diagnosis Date  . Eczema   . Breast mass     left    Past Surgical History  Procedure Laterality Date  . Wisdom tooth extraction N/A 2016    Family History  Problem Relation Age of Onset  . Asthma Father   . Cancer Paternal Grandmother    Social History:  reports that she has never smoked. She does not have any smokeless tobacco history on file. She reports that she drinks alcohol. She reports that she does not use illicit drugs.  Allergies:  Allergies  Allergen Reactions  . Penicillins Rash    .Marland KitchenHas patient had a PCN reaction causing immediate rash, facial/tongue/throat swelling, SOB or lightheadedness with hypotension: NO Has patient had a PCN reaction causing severe rash involving mucus membranes or skin necrosis: NO Has patient had a PCN reaction occurring within the last 10 years: No If all of the above answers are "NO", then may proceed with Cephalosporin use.     Medications Prior to Admission  Medication Sig Dispense Refill  . cholecalciferol (VITAMIN D) 1000 units tablet Take 1,000 Units by mouth daily.      No results found for this or any previous visit (from the past 48 hour(s)). No results found.  Review of Systems  All other systems reviewed and are negative.   Blood pressure 109/49, pulse 83, temperature 97.9 F (36.6 C), temperature source Oral, resp. rate 16, height 5\' 8"  (1.727 m), weight 64.864 kg (143 lb), last menstrual period 02/27/2015, SpO2 99 %. Physical Exam  Constitutional: She is oriented to person, place, and time. She appears well-developed and well-nourished. No distress.  HENT:  Head: Normocephalic and atraumatic.  Eyes: Pupils are equal, round, and reactive to light.  Neck: Normal range of motion. No thyromegaly present.   Cardiovascular: Normal rate and regular rhythm.   Respiratory: Effort normal. No respiratory distress.  Left breast mass upper inner quadrant  GI: Soft.  Musculoskeletal: Normal range of motion.  Neurological: She is alert and oriented to person, place, and time.  Skin: Skin is warm and dry. No rash noted. She is not diaphoretic. No erythema. No pallor.  Psychiatric: She has a normal mood and affect. Her behavior is normal. Judgment and thought content normal.     Assessment/Plan Left breast mass.  Probable fibroadenoma. Plan excision. Reviewed risks/benefits.    Stark Klein, MD 03/18/2015, 7:20 AM

## 2015-03-18 NOTE — Anesthesia Preprocedure Evaluation (Addendum)
Anesthesia Evaluation  Patient identified by MRN, date of birth, ID band Patient awake    Reviewed: Allergy & Precautions, NPO status , Patient's Chart, lab work & pertinent test results  History of Anesthesia Complications Negative for: history of anesthetic complications  Airway Mallampati: I  TM Distance: >3 FB Neck ROM: Full    Dental  (+) Teeth Intact   Pulmonary neg pulmonary ROS,  breath sounds clear to auscultation        Cardiovascular negative cardio ROS  Rhythm:Regular     Neuro/Psych negative neurological ROS  negative psych ROS   GI/Hepatic negative GI ROS, Neg liver ROS,   Endo/Other  negative endocrine ROS  Renal/GU negative Renal ROS     Musculoskeletal   Abdominal   Peds  Hematology negative hematology ROS (+)   Anesthesia Other Findings   Reproductive/Obstetrics                             Anesthesia Physical Anesthesia Plan  ASA: I  Anesthesia Plan: General   Post-op Pain Management:    Induction: Intravenous  Airway Management Planned: LMA  Additional Equipment: None  Intra-op Plan:   Post-operative Plan: Extubation in OR  Informed Consent: I have reviewed the patients History and Physical, chart, labs and discussed the procedure including the risks, benefits and alternatives for the proposed anesthesia with the patient or authorized representative who has indicated his/her understanding and acceptance.   Dental advisory given  Plan Discussed with: CRNA and Surgeon  Anesthesia Plan Comments:         Anesthesia Quick Evaluation  

## 2015-03-18 NOTE — Anesthesia Procedure Notes (Signed)
Procedure Name: LMA Insertion Date/Time: 03/18/2015 7:41 AM Performed by: Carleene Cooper A Pre-anesthesia Checklist: Patient identified, Emergency Drugs available, Suction available, Patient being monitored and Timeout performed Patient Re-evaluated:Patient Re-evaluated prior to inductionOxygen Delivery Method: Circle system utilized Preoxygenation: Pre-oxygenation with 100% oxygen Intubation Type: IV induction Ventilation: Mask ventilation without difficulty LMA: LMA with gastric port inserted LMA Size: 4.0 Number of attempts: 1 Placement Confirmation: positive ETCO2 and breath sounds checked- equal and bilateral Tube secured with: Tape Dental Injury: Teeth and Oropharynx as per pre-operative assessment

## 2015-03-19 ENCOUNTER — Other Ambulatory Visit: Payer: Self-pay | Admitting: Internal Medicine

## 2015-03-19 NOTE — Progress Notes (Signed)
Quick Note:  Please let patient know pathology is benign. ______ 

## 2015-09-19 ENCOUNTER — Encounter: Payer: Self-pay | Admitting: Internal Medicine

## 2015-09-19 ENCOUNTER — Ambulatory Visit (INDEPENDENT_AMBULATORY_CARE_PROVIDER_SITE_OTHER): Payer: 59 | Admitting: Internal Medicine

## 2015-09-19 VITALS — BP 106/62 | HR 88 | Temp 98.0°F | Resp 18 | Ht 67.0 in | Wt 141.0 lb

## 2015-09-19 DIAGNOSIS — Z0000 Encounter for general adult medical examination without abnormal findings: Secondary | ICD-10-CM

## 2015-09-19 NOTE — Progress Notes (Signed)
Annual Screening Comprehensive Examination   This very nice 20 y.o.female presents for complete physical.  Patient has no major health issues.  Patient reports no complaints at this time.   Finally, patient has history of Vitamin D Deficiency and last vitamin D was  Lab Results  Component Value Date   VD25OH 12 (L) 01/28/2015  .  Currently on supplementation  Periods have been regular.  She is not having any cramping or bleeding.  She did have breast surgery done in March.  She has not had any issues with recurrence of the mass.  She did get a little bit of a keloid present after the surgery.  She is not currently sexually active.    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  She is doing well.  She is not having issues.  Her grades are good.    No current outpatient prescriptions on file prior to visit.   No current facility-administered medications on file prior to visit.     Allergies  Allergen Reactions  . Penicillins Rash    .Marland KitchenHas patient had a PCN reaction causing immediate rash, facial/tongue/throat swelling, SOB or lightheadedness with hypotension: NO Has patient had a PCN reaction causing severe rash involving mucus membranes or skin necrosis: NO Has patient had a PCN reaction occurring within the last 10 years: No If all of the above answers are "NO", then may proceed with Cephalosporin use.     Past Medical History:  Diagnosis Date  . Breast mass    left  . Eczema      There is no immunization history on file for this patient.  Past Surgical History:  Procedure Laterality Date  . BREAST LUMPECTOMY Left 03/18/2015   Procedure: EXCISION OF LEFT BREAST MASS;  Surgeon: Stark Klein, MD;  Location: [facility];  Service: General;  Laterality: Left;  . WISDOM TOOTH EXTRACTION N/A 2016    Family History  Problem Relation Age of Onset  . Asthma Father   . Cancer Paternal Grandmother     Social History   Social History  . Marital status: Single   Spouse name: N/A  . Number of children: N/A  . Years of education: N/A   Occupational History  . Not on file.   Social History Main Topics  . Smoking status: Never Smoker  . Smokeless tobacco: Not on file  . Alcohol use 0.0 oz/week     Comment: rare  . Drug use: No  . Sexual activity: Yes    Birth control/ protection: Condom   Other Topics Concern  . Not on file   Social History Narrative  . No narrative on file    Review of Systems  Constitutional: Negative for chills, fever and malaise/fatigue.  HENT: Negative for congestion, ear pain and sore throat.   Eyes: Negative.   Respiratory: Negative for cough, shortness of breath and wheezing.   Cardiovascular: Negative for chest pain, palpitations and leg swelling.  Gastrointestinal: Negative for abdominal pain, blood in stool, constipation, diarrhea, heartburn and melena.  Genitourinary: Negative.   Skin: Negative.   Neurological: Negative for dizziness, sensory change, loss of consciousness and headaches.  Psychiatric/Behavioral: Negative for depression. The patient is not nervous/anxious and does not have insomnia.       Physical Exam  BP 106/62   Pulse 88   Temp 98 F (36.7 C) (Temporal)   Resp 18   Ht 5\' 7"  (1.702 m)   Wt 141 lb (64 kg)  LMP 09/11/2015   BMI 22.08 kg/m   General Appearance: Well nourished and in no apparent distress. Eyes: PERRLA, EOMs, conjunctiva no swelling or erythema, normal fundi and vessels. Sinuses: No frontal/maxillary tenderness ENT/Mouth: EACs patent / TMs  nl. Nares clear without erythema, swelling, mucoid exudates. Oral hygiene is good. No erythema, swelling, or exudate. Tongue normal, non-obstructing. Tonsils not swollen or erythematous. Hearing normal.  Neck: Supple, thyroid normal. No bruits, nodes or JVD. Respiratory: Respiratory effort normal.  BS equal and clear bilateral without rales, rhonci, wheezing or stridor. Cardio: Heart sounds are normal with regular rate and  rhythm and no murmurs, rubs or gallops. Peripheral pulses are normal and equal bilaterally without edema. No aortic or femoral bruits. Chest: symmetric with normal excursions and percussion. Abdomen: Flat, soft, with bowl sounds. Nontender, no guarding, rebound, hernias, masses, or organomegaly.  Lymphatics: Non tender without lymphadenopathy.  Musculoskeletal: Full ROM all peripheral extremities, joint stability, 5/5 strength, and normal gait. Skin: Warm and dry without rashes, lesions, cyanosis, clubbing or  ecchymosis.  Neuro: Cranial nerves intact, reflexes equal bilaterally. Normal muscle tone, no cerebellar symptoms. Sensation intact.  Pysch: Awake and oriented X 3, normal affect, Insight and Judgment appropriate.   Assessment and Plan    1. Routine general medical examination at a health care facility -cont healthy diet and exercise -cont self breast exams at home -fax over shot records. -based on age should be up to date on tetanus -start pap smears next year -defer lab work to next year    Continue prudent diet as discussed, weight control, regular exercise, and medications. Routine screening labs and tests as requested with regular follow-up as recommended.  Over 40 minutes of exam, counseling, chart review and critical decision making was performed

## 2016-05-27 ENCOUNTER — Encounter: Payer: Self-pay | Admitting: *Deleted

## 2016-06-10 ENCOUNTER — Encounter: Payer: Self-pay | Admitting: *Deleted

## 2016-09-21 ENCOUNTER — Encounter: Payer: Self-pay | Admitting: Internal Medicine

## 2017-02-14 NOTE — Progress Notes (Signed)
Complete Physical  Assessment and Plan:  Annette Zuniga was seen today for annual exam.  Diagnoses and all orders for this visit:  Routine general medical examination at a health care facility General lifestyle, health recommendations and safety reviewed and discussed.  Screened for appropriate preventive interventions and provided as appropriate today Healthy habits for sleep discussed and provided  Screening cholesterol level -     Lipid panel  Screening for cardiovascular condition -     EKG 12-Lead  Screening for diabetes mellitus -     Hemoglobin A1c  Screening for hematuria or proteinuria -     Urinalysis w microscopic + reflex cultur  Screening for thyroid disorder -     TSH  Vitamin D deficiency -     VITAMIN D 25 Hydroxy (Vit-D Deficiency, Fractures)  Screening for cervical cancer Defer today per patient request; will follow up in 2 months  Medication management -     CBC with Differential/Platelet -     BASIC METABOLIC PANEL WITH GFR -     Hepatic function panel  Screening for STDs (sexually transmitted diseases) -     C. trachomatis/N. gonorrhoeae RNA -     HIV antibody -     RPR  Need for tetanus, diphtheria, and acellular pertussis (Tdap) vaccine -     Tdap vaccine greater than or equal to 7yo IM  Other orders -     Cancel: Cytology - PAP - per patient preference will defer today and follow up in 2 months  Right submandibular mass  - US soft tissue neck  Discussed med's effects and SE's. Screening labs and tests as requested with regular follow-up as recommended. Over 40 minutes of exam, counseling, chart review, and complex, high level critical decision making was performed this visit.   Future Appointments  Date Time Provider [place]  04/19/2017  2:30 PM Liane Comber, NP [facility] None  02/15/2018  2:00 PM Liane Comber, NP [facility] None    HPI  22 y.o. Caucasian female, works as a Educational psychologist,  presents for a complete physical and  follow up for has Routine general medical examination at a health care facility on their problem list. She has no concerns today.   BMI is Body mass index is 21.13 kg/m., she has been working on diet - she has been cutting down on meat, cutting down on soda. She is eating a lot more vegetables and fruit. She estimates 1.5 L of water intake, is working to increase.  Wt Readings from Last 3 Encounters:  02/15/17 139 lb (63 kg)  09/19/15 141 lb (64 kg)  03/18/15 143 lb (64.9 kg)   Today their BP is BP: (!) 90/52 She does not workout but is on her feet a lot at work.  She denies chest pain, shortness of breath, dizziness.   The cholesterol last visit was:   Lab Results  Component Value Date   CHOL 144 01/28/2015   HDL 56 01/28/2015   LDLCALC 77 01/28/2015   TRIG 54 01/28/2015   CHOLHDL 2.6 01/28/2015    Last A1C in the office was:  Lab Results  Component Value Date   HGBA1C 5.1 01/28/2015   Last GFR: Lab Results  Component Value Date   GFRNONAA >89 01/28/2015   Patient is not on Vitamin D supplement and low at last check:    Lab Results  Component Value Date   VD25OH 12 (L) 01/28/2015      Current Medications:  No current outpatient  medications on file prior to visit.   No current facility-administered medications on file prior to visit.    Allergies:  Allergies  Allergen Reactions  . Penicillins Rash    .Marland KitchenHas patient had a PCN reaction causing immediate rash, facial/tongue/throat swelling, SOB or lightheadedness with hypotension: NO Has patient had a PCN reaction causing severe rash involving mucus membranes or skin necrosis: NO Has patient had a PCN reaction occurring within the last 10 years: No If all of the above answers are "NO", then may proceed with Cephalosporin use.    Medical History:  She has Routine general medical examination at a health care facility on their problem list. Health Maintenance:   Immunization History  Administered Date(s) Administered   . HPV 9-valent 02/15/2017  . Tdap 02/15/2017    Tetanus: Unsure, ?remote  - will give today  Flu vaccine: n/a in office  HPV: 0/3 will initiate today  LMP: Patient's last menstrual period was 02/13/2017 (exact date). Pap: DUE - will defer per patient preference until next visit in 2 months MGM: never   Last Dental Exam: 2 weeks ago; goes every 6 months Last Eye Exam: Remote   Patient Care Team: Unk Pinto, MD as PCP - General (Internal Medicine)  Surgical History:  She has a past surgical history that includes Wisdom tooth extraction (N/A, 2016) and Breast lumpectomy (Left, 03/18/2015). Family History:  Herfamily history includes Asthma in her father; Brain cancer in her paternal aunt and paternal grandmother. Social History:  She reports that  has never smoked. she has never used smokeless tobacco. She reports that she drinks alcohol. She reports that she does not use drugs.  Review of Systems: Review of Systems  Constitutional: Negative for malaise/fatigue and weight loss.  HENT: Negative for hearing loss and tinnitus.   Eyes: Negative for blurred vision and double vision.  Respiratory: Negative for cough, shortness of breath and wheezing.   Cardiovascular: Negative for chest pain, palpitations, orthopnea, claudication and leg swelling.  Gastrointestinal: Negative for abdominal pain, blood in stool, constipation, diarrhea, heartburn, melena, nausea and vomiting.  Genitourinary: Negative.   Musculoskeletal: Negative for joint pain and myalgias.  Skin: Negative for rash.  Neurological: Negative for dizziness, tingling, sensory change, weakness and headaches.  Endo/Heme/Allergies: Negative for polydipsia.  Psychiatric/Behavioral: Negative for depression. The patient has insomnia (Some difficulty falling sleep after working ). The patient is not nervous/anxious.   All other systems reviewed and are negative.   Physical Exam: Estimated body mass index is 21.13 kg/m as  calculated from the following:   Height as of this encounter: 5\' 8"  (1.727 m).   Weight as of this encounter: 139 lb (63 kg). BP (!) 90/52   Pulse 72   Temp (!) 97.5 F (36.4 C)   Ht 5\' 8"  (1.727 m)   Wt 139 lb (63 kg)   LMP 02/13/2017 (Exact Date)   SpO2 98%   BMI 21.13 kg/m  General Appearance: Well nourished, in no apparent distress.  Eyes: PERRLA, EOMs, conjunctiva no swelling or erythema, normal fundi and vessels.  Sinuses: No Frontal/maxillary tenderness  ENT/Mouth: Ext aud canals clear, normal light reflex with TMs without erythema, bulging. Good dentition. No erythema, swelling, or exudate on post pharynx. Tonsils not swollen or erythematous. Hearing normal.  Neck: Supple, thyroid normal. No bruits - smooth enlarged ? Lymph node to R submandibular area (per patient present for 1-2 years, non-tender, does appear to be slowly increasing in size) Respiratory: Respiratory effort normal, BS equal bilaterally without  rales, rhonchi, wheezing or stridor.  Cardio: RRR without murmurs, rubs or gallops. Brisk peripheral pulses without edema.  Chest: symmetric, with normal excursions and percussion.  Breasts: Symmetric, without lumps, nipple discharge, retractions.  Abdomen: Soft, nontender, no guarding, rebound, hernias, masses, or organomegaly.  Lymphatics: Non tender;  Smooth palpable ?lymph node to R submandibular area without tenderness  Genitourinary: Defer to next visit per request Musculoskeletal: Full ROM all peripheral extremities,5/5 strength, and normal gait.  Skin: Warm, dry without rashes, lesions, ecchymosis. Neuro: Cranial nerves intact, reflexes equal bilaterally. Normal muscle tone, no cerebellar symptoms. Sensation intact.  Psych: Awake and oriented X 3, normal affect, Insight and Judgment appropriate.   EKG: Obtained for baseline; WNL  Gorden Harms Abeeha Twist 6:02 PM [facility]

## 2017-02-15 ENCOUNTER — Encounter: Payer: Self-pay | Admitting: Adult Health

## 2017-02-15 ENCOUNTER — Ambulatory Visit (INDEPENDENT_AMBULATORY_CARE_PROVIDER_SITE_OTHER): Payer: 59 | Admitting: Adult Health

## 2017-02-15 VITALS — BP 90/52 | HR 72 | Temp 97.5°F | Ht 68.0 in | Wt 139.0 lb

## 2017-02-15 DIAGNOSIS — Z1329 Encounter for screening for other suspected endocrine disorder: Secondary | ICD-10-CM

## 2017-02-15 DIAGNOSIS — R220 Localized swelling, mass and lump, head: Secondary | ICD-10-CM

## 2017-02-15 DIAGNOSIS — Z0000 Encounter for general adult medical examination without abnormal findings: Secondary | ICD-10-CM | POA: Diagnosis not present

## 2017-02-15 DIAGNOSIS — Z131 Encounter for screening for diabetes mellitus: Secondary | ICD-10-CM

## 2017-02-15 DIAGNOSIS — Z113 Encounter for screening for infections with a predominantly sexual mode of transmission: Secondary | ICD-10-CM

## 2017-02-15 DIAGNOSIS — Z136 Encounter for screening for cardiovascular disorders: Secondary | ICD-10-CM | POA: Diagnosis not present

## 2017-02-15 DIAGNOSIS — Z13220 Encounter for screening for lipoid disorders: Secondary | ICD-10-CM

## 2017-02-15 DIAGNOSIS — Z1389 Encounter for screening for other disorder: Secondary | ICD-10-CM

## 2017-02-15 DIAGNOSIS — E559 Vitamin D deficiency, unspecified: Secondary | ICD-10-CM

## 2017-02-15 DIAGNOSIS — Z23 Encounter for immunization: Secondary | ICD-10-CM

## 2017-02-15 DIAGNOSIS — Z79899 Other long term (current) drug therapy: Secondary | ICD-10-CM

## 2017-02-15 DIAGNOSIS — I10 Essential (primary) hypertension: Secondary | ICD-10-CM | POA: Diagnosis not present

## 2017-02-15 NOTE — Patient Instructions (Signed)
Aim for 7+ servings of fruits and vegetables daily  60- 80+ fluid ounces of water or unsweet tea for healthy kidneys  1 drink of alcohol per day  Limit animal fats in diet for cholesterol and heart health - choose grass fed whenever available  Aim for low stress - take time to unwind and care for your mental health  Aim for 150 min of moderate intensity exercise weekly for heart health, and weights twice weekly for bone health           Aim for 7-9 hours of sleep daily  11 Tips to Follow:  1. No caffeine after 3pm: Avoid beverages with caffeine (soda, tea, energy drinks, etc.) especially after 3pm. 2. Don't go to bed hungry: Have your evening meal at least 3 hrs. before going to sleep. It's fine to have a small bedtime snack such as a glass of milk and a few crackers but don't have a big meal. 3. Have a nightly routine before bed: Plan on "winding down" before you go to sleep. Begin relaxing about 1 hour before you go to bed. Try doing a quiet activity such as listening to calming music, reading a book or meditating. 4. Turn off the TV and ALL electronics including video games, tablets, laptops, etc. 1 hour before sleep, and keep them out of the bedroom. 5. Turn off your cell phone and all notifications (new email and text alerts) or even better, leave your phone outside your room while you sleep. Studies have shown that a part of your brain continues to respond to certain lights and sounds even while you're still asleep. 6. Make your bedroom quiet, dark and cool. If you can't control the noise, try wearing earplugs or using a fan to block out other sounds. 7. Practice relaxation techniques. Try reading a book or meditating or drain your brain by writing a list of what you need to do the next day. 8. Don't nap unless you feel sick: you'll have a better night's sleep. 9. Don't smoke, or quit if you do. Nicotine, alcohol, and marijuana can all keep you awake. Talk to your health care provider if  you need help with substance use. 10. Most importantly, wake up at the same time every day (or within 1 hour of your usual wake up time) EVEN on the weekends. A regular wake up time promotes sleep hygiene and prevents sleep problems. 11. Reduce exposure to bright light in the last three hours of the day before going to sleep. Maintaining good sleep hygiene and having good sleep habits lower your risk of developing sleep problems. Getting better sleep can also improve your concentration and alertness. Try the simple steps in this guide. If you still have trouble getting enough rest, make an appointment with your health care provider.

## 2017-02-16 ENCOUNTER — Other Ambulatory Visit: Payer: Self-pay | Admitting: Adult Health

## 2017-02-16 DIAGNOSIS — A749 Chlamydial infection, unspecified: Secondary | ICD-10-CM

## 2017-02-16 LAB — HEPATIC FUNCTION PANEL
AG RATIO: 1.7 (calc) (ref 1.0–2.5)
ALBUMIN MSPROF: 4.3 g/dL (ref 3.6–5.1)
ALT: 18 U/L (ref 6–29)
AST: 18 U/L (ref 10–30)
Alkaline phosphatase (APISO): 45 U/L (ref 33–115)
BILIRUBIN TOTAL: 0.8 mg/dL (ref 0.2–1.2)
Bilirubin, Direct: 0.2 mg/dL (ref 0.0–0.2)
Globulin: 2.6 g/dL (calc) (ref 1.9–3.7)
Indirect Bilirubin: 0.6 mg/dL (calc) (ref 0.2–1.2)
Total Protein: 6.9 g/dL (ref 6.1–8.1)

## 2017-02-16 LAB — CBC WITH DIFFERENTIAL/PLATELET
BASOS PCT: 0.5 %
Basophils Absolute: 29 cells/uL (ref 0–200)
EOS ABS: 604 {cells}/uL — AB (ref 15–500)
Eosinophils Relative: 10.6 %
HEMATOCRIT: 36.6 % (ref 35.0–45.0)
Hemoglobin: 12.5 g/dL (ref 11.7–15.5)
LYMPHS ABS: 1619 {cells}/uL (ref 850–3900)
MCH: 29.5 pg (ref 27.0–33.0)
MCHC: 34.2 g/dL (ref 32.0–36.0)
MCV: 86.3 fL (ref 80.0–100.0)
MPV: 12.2 fL (ref 7.5–12.5)
Monocytes Relative: 6.1 %
NEUTROS PCT: 54.4 %
Neutro Abs: 3101 cells/uL (ref 1500–7800)
PLATELETS: 239 10*3/uL (ref 140–400)
RBC: 4.24 10*6/uL (ref 3.80–5.10)
RDW: 11.6 % (ref 11.0–15.0)
TOTAL LYMPHOCYTE: 28.4 %
WBC: 5.7 10*3/uL (ref 3.8–10.8)
WBCMIX: 348 {cells}/uL (ref 200–950)

## 2017-02-16 LAB — TSH: TSH: 0.7 mIU/L

## 2017-02-16 LAB — URINALYSIS W MICROSCOPIC + REFLEX CULTURE
BILIRUBIN URINE: NEGATIVE
Bacteria, UA: NONE SEEN /HPF
Glucose, UA: NEGATIVE
Hyaline Cast: NONE SEEN /LPF
KETONES UR: NEGATIVE
LEUKOCYTE ESTERASE: NEGATIVE
NITRITES URINE, INITIAL: NEGATIVE
PH: 6 (ref 5.0–8.0)
Protein, ur: NEGATIVE
RBC / HPF: NONE SEEN /HPF (ref 0–2)
SPECIFIC GRAVITY, URINE: 1.017 (ref 1.001–1.03)
SQUAMOUS EPITHELIAL / LPF: NONE SEEN /HPF (ref <=5)
WBC, UA: NONE SEEN /HPF (ref 0–5)

## 2017-02-16 LAB — BASIC METABOLIC PANEL WITH GFR
BUN/Creatinine Ratio: 20 (calc) (ref 6–22)
BUN: 10 mg/dL (ref 7–25)
CALCIUM: 9.6 mg/dL (ref 8.6–10.2)
CO2: 30 mmol/L (ref 20–32)
Chloride: 106 mmol/L (ref 98–110)
Creat: 0.49 mg/dL — ABNORMAL LOW (ref 0.50–1.10)
GFR, EST AFRICAN AMERICAN: 161 mL/min/{1.73_m2} (ref >=60)
GFR, EST NON AFRICAN AMERICAN: 139 mL/min/{1.73_m2} (ref >=60)
Glucose, Bld: 85 mg/dL (ref 65–99)
Potassium: 5.3 mmol/L (ref 3.5–5.3)
Sodium: 142 mmol/L (ref 135–146)

## 2017-02-16 LAB — C. TRACHOMATIS/N. GONORRHOEAE RNA
C. trachomatis RNA, TMA: DETECTED — AB
N. gonorrhoeae RNA, TMA: NOT DETECTED

## 2017-02-16 LAB — LIPID PANEL
CHOL/HDL RATIO: 2.7 (calc) (ref <5.0)
Cholesterol: 134 mg/dL (ref <200)
HDL: 49 mg/dL — ABNORMAL LOW (ref >50)
LDL CHOLESTEROL (CALC): 67 mg/dL
Non-HDL Cholesterol (Calc): 85 mg/dL (calc) (ref <130)
TRIGLYCERIDES: 98 mg/dL (ref <150)

## 2017-02-16 LAB — HEMOGLOBIN A1C
EAG (MMOL/L): 5.2 (calc)
Hgb A1c MFr Bld: 4.9 % of total Hgb (ref <5.7)
MEAN PLASMA GLUCOSE: 94 (calc)

## 2017-02-16 LAB — RPR: RPR: NONREACTIVE

## 2017-02-16 LAB — HIV ANTIBODY (ROUTINE TESTING W REFLEX): HIV 1&2 Ab, 4th Generation: NONREACTIVE

## 2017-02-16 LAB — VITAMIN D 25 HYDROXY (VIT D DEFICIENCY, FRACTURES): Vit D, 25-Hydroxy: 20 ng/mL — ABNORMAL LOW (ref 30–100)

## 2017-02-16 LAB — NO CULTURE INDICATED

## 2017-02-16 MED ORDER — AZITHROMYCIN 250 MG PO TABS: ORAL_TABLET | ORAL | 0 refills | Status: DC

## 2017-02-20 ENCOUNTER — Encounter: Payer: Self-pay | Admitting: Adult Health

## 2017-02-21 ENCOUNTER — Other Ambulatory Visit: Payer: Self-pay | Admitting: Adult Health

## 2017-02-21 DIAGNOSIS — A749 Chlamydial infection, unspecified: Secondary | ICD-10-CM

## 2017-02-21 MED ORDER — AZITHROMYCIN 250 MG PO TABS: ORAL_TABLET | ORAL | 0 refills | Status: DC

## 2017-02-24 ENCOUNTER — Other Ambulatory Visit: Payer: 59

## 2017-04-18 NOTE — Progress Notes (Deleted)
Assessment and Plan:   Diagnoses and all orders for this visit:  Encounter for cervical Pap smear with pelvic exam  Screening for cervical cancer  Need for prophylactic vaccination against human papillomavirus (HPV) types 6, 11, 16, and 18      Further disposition pending results of labs. Discussed med's effects and SE's.   Over 30 minutes of exam, counseling, chart review, and critical decision making was performed.   Future Appointments  Date Time Provider [place]  04/19/2017  2:30 PM Liane Comber, NP [facility] None  02/15/2018  2:00 PM Liane Comber, NP [facility] None    ------------------------------------------------------------------------------------------------------------------   HPI There were no vitals taken for this visit.  22 y.o.female presents for PAP smear after declining at recent CPE by patient preference due to active bleeding on menstrual cycle at the time. She also has + hx of chlamydia which was treated previously and requests screening for this today.   Submandibular mass ***  HPV vaccine #2 ***  Past Medical History:  Diagnosis Date  . Breast mass    left  . Eczema      Allergies  Allergen Reactions  . Penicillins Rash    .Marland KitchenHas patient had a PCN reaction causing immediate rash, facial/tongue/throat swelling, SOB or lightheadedness with hypotension: NO Has patient had a PCN reaction causing severe rash involving mucus membranes or skin necrosis: NO Has patient had a PCN reaction occurring within the last 10 years: No If all of the above answers are "NO", then may proceed with Cephalosporin use.     Current Outpatient Medications on File Prior to Visit  Medication Sig  . azithromycin (ZITHROMAX) 250 MG tablet Please take 4 tablets PO once.   No current facility-administered medications on file prior to visit.     ROS: all negative except above.   Physical Exam:  There were no vitals taken for this visit.  General  Appearance: Well nourished, in no apparent distress. Eyes: PERRLA, EOMs, conjunctiva no swelling or erythema Sinuses: No Frontal/maxillary tenderness ENT/Mouth: Ext aud canals clear, TMs without erythema, bulging. No erythema, swelling, or exudate on post pharynx.  Tonsils not swollen or erythematous. Hearing normal.  Neck: Supple, thyroid normal.  Respiratory: Respiratory effort normal, BS equal bilaterally without rales, rhonchi, wheezing or stridor.  Cardio: RRR with no MRGs. Brisk peripheral pulses without edema.  Abdomen: Soft, + BS.  Non tender, no guarding, rebound, hernias, masses. Lymphatics: Non tender without lymphadenopathy.  Musculoskeletal: Full ROM, 5/5 strength, normal gait.  Skin: Warm, dry without rashes, lesions, ecchymosis.  Neuro: Cranial nerves intact. Normal muscle tone, no cerebellar symptoms. Sensation intact.  Psych: Awake and oriented X 3, normal affect, Insight and Judgment appropriate.     Izora Ribas, NP 2:08 PM [facility]

## 2017-04-19 ENCOUNTER — Ambulatory Visit: Payer: Self-pay | Admitting: Adult Health

## 2017-11-29 DIAGNOSIS — H6123 Impacted cerumen, bilateral: Secondary | ICD-10-CM | POA: Diagnosis not present

## 2017-11-29 DIAGNOSIS — H60502 Unspecified acute noninfective otitis externa, left ear: Secondary | ICD-10-CM | POA: Diagnosis not present

## 2018-02-15 ENCOUNTER — Encounter: Payer: Self-pay | Admitting: Adult Health

## 2018-05-18 DIAGNOSIS — Z8619 Personal history of other infectious and parasitic diseases: Secondary | ICD-10-CM | POA: Insufficient documentation

## 2018-05-18 DIAGNOSIS — E559 Vitamin D deficiency, unspecified: Secondary | ICD-10-CM | POA: Insufficient documentation

## 2018-05-18 NOTE — Progress Notes (Deleted)
Complete Physical  Assessment and Plan:  Annette Zuniga was seen today for annual exam.  Diagnoses and all orders for this visit:  Routine general medical examination at a health care facility General lifestyle, health recommendations and safety reviewed and discussed.  Screened for appropriate preventive interventions and provided as appropriate today Healthy habits for sleep discussed and provided  Anemia, unspecified       -      CBC       -      Iron/ferritin panel       -      B12  Screening for thyroid disorder -     TSH  Vitamin D deficiency -     VITAMIN D 25 Hydroxy (Vit-D Deficiency, Fractures)  Screening for cervical cancer      -       PAP with STD screen  history of chlamydia Completed tx; never returned to confirm resolution Check STDs with PAP today   Medication management -     CBC with Differential/Platelet -     CMP/GFR -     Magnesium   Right submandibular mass ***  - US soft tissue neck  Discussed med's effects and SE's. Screening labs and tests as requested with regular follow-up as recommended. Over 40 minutes of exam, counseling, chart review, and complex, high level critical decision making was performed this visit.   Future Appointments  Date Time Provider [place]  05/19/2018  2:00 PM Liane Comber, NP [facility] None  05/31/2019  2:00 PM Liane Comber, NP [facility] None    HPI  Annette Zuniga presents for a complete physical and follow up for has Routine general medical examination at a health care facility on their problem list. Annette Zuniga has no concerns today.   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 Annette Zuniga is eating a lot more vegetables and fruit. Annette Zuniga estimates 1.5 L of water intake, is working to  increase.  Wt Readings from Last 3 Encounters:  02/15/17 139 lb (63 kg)  09/19/15 141 lb (64 kg)  03/18/15 143 lb (64.9 kg)   Today their BP is   Annette Zuniga does not workout but is on her feet a lot at work.  Annette Zuniga denies chest pain, shortness of breath, dizziness.   The cholesterol last visit was:   Lab Results  Component Value Date   CHOL 134 02/15/2017   HDL 49 (L) 02/15/2017   LDLCALC 67 02/15/2017   TRIG 98 02/15/2017   CHOLHDL 2.7 02/15/2017    Last A1C in the office was:  Lab Results  Component Value Date   HGBA1C 4.9 02/15/2017   Last GFR: Lab Results  Component Value Date   GFRNONAA 139 02/15/2017   Patient is not on Vitamin D supplement and low at last check:    Lab Results  Component Value Date   VD25OH 20 (L) 02/15/2017     Lab Results  Component Value Date   VITAMINB12 622 01/28/2015   Lab Results  Component Value Date   IRON 72 01/28/2015   TIBC 336 01/28/2015     Current Medications:  Current Outpatient Medications on File Prior to Visit  Medication Sig Dispense Refill  .  azithromycin (ZITHROMAX) 250 MG tablet Please take 4 tablets PO once. 4 tablet 0   No current facility-administered medications on file prior to visit.    Allergies:  Allergies  Allergen Reactions  . Penicillins Rash    .Marland KitchenHas patient had a PCN reaction causing immediate rash, facial/tongue/throat swelling, SOB or lightheadedness with hypotension: NO Has patient had a PCN reaction causing severe rash involving mucus membranes or skin necrosis: NO Has patient had a PCN reaction occurring within the last 10 years: No If all of the above answers are "NO", then may proceed with Cephalosporin use.    Medical History:  Annette Zuniga has Routine general medical examination at a health care facility on their problem list. Health Maintenance:   Immunization History  Administered Date(s) Administered  . HPV 9-valent 02/15/2017  . Tdap 02/15/2017    Tetanus: 2019 Flu vaccine: n/a in office   HPV: 1/3 *** ? restart  LMP: No LMP recorded. Pap: DUE -  MGM: never   Last Dental Exam: 2 weeks ago; goes every 6 months Last Eye Exam: Remote   Patient Care Team: Unk Pinto, MD as PCP - General (Internal Medicine)  Surgical History:  Annette Zuniga has a past surgical history that includes Wisdom tooth extraction (N/A, 2016) and Breast lumpectomy (Left, 03/18/2015). Family History:  Herfamily history includes Asthma in her father; Brain cancer in her paternal aunt and paternal grandmother. Social History:  Annette Zuniga reports that Annette Zuniga has never smoked. Annette Zuniga has never used smokeless tobacco. Annette Zuniga reports current alcohol use. Annette Zuniga reports that Annette Zuniga does not use drugs.  Review of Systems: Review of Systems  Constitutional: Negative for malaise/fatigue and weight loss.  HENT: Negative for hearing loss and tinnitus.   Eyes: Negative for blurred vision and double vision.  Respiratory: Negative for cough, shortness of breath and wheezing.   Cardiovascular: Negative for chest pain, palpitations, orthopnea, claudication and leg swelling.  Gastrointestinal: Negative for abdominal pain, blood in stool, constipation, diarrhea, heartburn, melena, nausea and vomiting.  Genitourinary: Negative.   Musculoskeletal: Negative for joint pain and myalgias.  Skin: Negative for rash.  Neurological: Negative for dizziness, tingling, sensory change, weakness and headaches.  Endo/Heme/Allergies: Negative for polydipsia.  Psychiatric/Behavioral: Negative for depression. The patient has insomnia (Some difficulty falling sleep after working ). The patient is not nervous/anxious.   All other systems reviewed and are negative.   Physical Exam: Estimated body mass index is 21.13 kg/m as calculated from the following:   Height as of 02/15/17: 5\' 8"  (1.727 m).   Weight as of 02/15/17: 139 lb (63 kg). There were no vitals taken for this visit. General Appearance: Well nourished, in no apparent distress.  Eyes: PERRLA, EOMs,  conjunctiva no swelling or erythema, normal fundi and vessels.  Sinuses: No Frontal/maxillary tenderness  ENT/Mouth: Ext aud canals clear, normal light reflex with TMs without erythema, bulging. Good dentition. No erythema, swelling, or exudate on post pharynx. Tonsils not swollen or erythematous. Hearing normal.  Neck: Supple, thyroid normal. No bruits - smooth enlarged ? Lymph node to R submandibular area (per patient present for 1-2 years, non-tender, does appear to be slowly increasing in size) Respiratory: Respiratory effort normal, BS equal bilaterally without rales, rhonchi, wheezing or stridor.  Cardio: RRR without murmurs, rubs or gallops. Brisk peripheral pulses without edema.  Chest: symmetric, with normal excursions and percussion.  Breasts: Symmetric, without lumps, nipple discharge, retractions.  Abdomen: Soft, nontender, no guarding, rebound, hernias, masses, or organomegaly.  Lymphatics: Non tender;  Smooth palpable ?lymph node  to R submandibular area without tenderness  Genitourinary: Defer to next visit per request Musculoskeletal: Full ROM all peripheral extremities,5/5 strength, and normal gait.  Skin: Warm, dry without rashes, lesions, ecchymosis. Neuro: Cranial nerves intact, reflexes equal bilaterally. Normal muscle tone, no cerebellar symptoms. Sensation intact.  Psych: Awake and oriented X 3, normal affect, Insight and Judgment appropriate.   EKG: Normal WNL in chart; defer  Izora Ribas 1:55 PM [facility]

## 2018-05-19 ENCOUNTER — Encounter: Payer: 59 | Admitting: Adult Health

## 2018-05-19 ENCOUNTER — Other Ambulatory Visit: Payer: Self-pay

## 2018-07-18 ENCOUNTER — Encounter: Payer: Self-pay | Admitting: Adult Health

## 2019-05-31 ENCOUNTER — Encounter: Payer: Self-pay | Admitting: Adult Health

## 2020-05-22 ENCOUNTER — Other Ambulatory Visit: Payer: Self-pay

## 2020-05-22 ENCOUNTER — Encounter (HOSPITAL_COMMUNITY): Payer: Self-pay

## 2020-05-22 ENCOUNTER — Emergency Department (HOSPITAL_COMMUNITY)
Admission: EM | Admit: 2020-05-22 | Discharge: 2020-05-23 | Disposition: A | Discharge status: Home / Self Care | Payer: 59 | Attending: Emergency Medicine | Admitting: Emergency Medicine

## 2020-05-22 ENCOUNTER — Emergency Department (HOSPITAL_COMMUNITY): Payer: 59

## 2020-05-22 DIAGNOSIS — R062 Wheezing: Secondary | ICD-10-CM | POA: Diagnosis present

## 2020-05-22 DIAGNOSIS — R000 Tachycardia, unspecified: Secondary | ICD-10-CM | POA: Diagnosis not present

## 2020-05-22 DIAGNOSIS — Z20822 Contact with and (suspected) exposure to covid-19: Secondary | ICD-10-CM | POA: Diagnosis not present

## 2020-05-22 DIAGNOSIS — R0600 Dyspnea, unspecified: Secondary | ICD-10-CM | POA: Insufficient documentation

## 2020-05-22 LAB — CBC
HCT: 45.4 % (ref 36.0–46.0)
Hemoglobin: 15.5 g/dL — ABNORMAL HIGH (ref 12.0–15.0)
MCH: 30 pg (ref 26.0–34.0)
MCHC: 34.1 g/dL (ref 30.0–36.0)
MCV: 87.8 fL (ref 80.0–100.0)
Platelets: 257 10*3/uL (ref 150–400)
RBC: 5.17 MIL/uL — ABNORMAL HIGH (ref 3.87–5.11)
RDW: 12 % (ref 11.5–15.5)
WBC: 13.6 10*3/uL — ABNORMAL HIGH (ref 4.0–10.5)
nRBC: 0 % (ref 0.0–0.2)

## 2020-05-22 LAB — BASIC METABOLIC PANEL
Anion gap: 5 (ref 5–15)
BUN: 14 mg/dL (ref 6–20)
CO2: 28 mmol/L (ref 22–32)
Calcium: 9.7 mg/dL (ref 8.9–10.3)
Chloride: 104 mmol/L (ref 98–111)
Creatinine, Ser: 0.63 mg/dL (ref 0.44–1.00)
GFR, Estimated: >60 mL/min (ref >60)
Glucose, Bld: 99 mg/dL (ref 70–99)
Potassium: 4.5 mmol/L (ref 3.5–5.1)
Sodium: 137 mmol/L (ref 135–145)

## 2020-05-22 LAB — RESP PANEL BY RT-PCR (FLU A&B, COVID) ARPGX2
Influenza A by PCR: NEGATIVE
Influenza B by PCR: NEGATIVE
SARS Coronavirus 2 by RT PCR: NEGATIVE

## 2020-05-22 LAB — I-STAT BETA HCG BLOOD, ED (MC, WL, AP ONLY): I-stat hCG, quantitative: <5 m[IU]/mL (ref <5)

## 2020-05-22 LAB — TROPONIN I (HIGH SENSITIVITY): Troponin I (High Sensitivity): <2 ng/L (ref <18)

## 2020-05-22 LAB — D-DIMER, QUANTITATIVE: D-Dimer, Quant: 0.62 ug/mL-FEU — ABNORMAL HIGH (ref 0.00–0.50)

## 2020-05-22 MED ORDER — AEROCHAMBER Z-STAT PLUS/MEDIUM MISC
1.0000 | Freq: Once | Status: AC
  Administered 2020-05-22: 1
  Filled 2020-05-22: qty 1

## 2020-05-22 MED ORDER — ALBUTEROL SULFATE (2.5 MG/3ML) 0.083% IN NEBU
5.0000 mg | INHALATION_SOLUTION | Freq: Once | RESPIRATORY_TRACT | Status: AC
  Administered 2020-05-22: 5 mg via RESPIRATORY_TRACT
  Filled 2020-05-22: qty 6

## 2020-05-22 MED ORDER — METHYLPREDNISOLONE SODIUM SUCC 125 MG IJ SOLR
125.0000 mg | Freq: Once | INTRAMUSCULAR | Status: AC
  Administered 2020-05-22: 125 mg via INTRAVENOUS
  Filled 2020-05-22: qty 2

## 2020-05-22 MED ORDER — PREDNISONE 10 MG PO TABS: 20.0000 mg | ORAL_TABLET | Freq: Every day | ORAL | 0 refills | Status: DC

## 2020-05-22 MED ORDER — ALBUTEROL SULFATE HFA 108 (90 BASE) MCG/ACT IN AERS
2.0000 | INHALATION_SPRAY | RESPIRATORY_TRACT | Status: DC | PRN
  Administered 2020-05-22: 2 via RESPIRATORY_TRACT

## 2020-05-22 MED ORDER — SODIUM CHLORIDE 0.9 % IV BOLUS
1000.0000 mL | Freq: Once | INTRAVENOUS | Status: AC
  Administered 2020-05-22: 1000 mL via INTRAVENOUS

## 2020-05-22 NOTE — ED Provider Notes (Addendum)
[facility] DEPT Provider Note   CSN: [id number] Arrival date & time: 05/22/20  2120     History Chief Complaint  Patient presents with  . Shortness of Breath  . Cough    Annette Zuniga is a 25 y.o. female.  HPI   25 year old female history of eczema presents today with dyspnea.  She states she began having some dyspnea this morning on awakening.  She reports that she had a similar episode in February.  She states that that time she got better on her own.  She has no known allergies.  She denies any history of asthma.  She has not had any fever or productive cough. She denies any history of pneumothorax, pneumonia, DVT, or PE. Past Medical History:  Diagnosis Date  . Breast mass    left  . Eczema     Patient Active Problem List   Diagnosis Date Noted  . History of chlamydia infection 05/18/2018  . Vitamin D deficiency 05/18/2018  . Routine general medical examination at a health care facility 01/28/2015    Past Surgical History:  Procedure Laterality Date  . BREAST LUMPECTOMY Left 03/18/2015   Procedure: EXCISION OF LEFT BREAST MASS;  Surgeon: Stark Klein, MD;  Location: [facility];  Service: General;  Laterality: Left;  . WISDOM TOOTH EXTRACTION N/A 2016     OB History   No obstetric history on file.     Family History  Problem Relation Age of Onset  . Asthma Father   . Brain cancer Paternal Grandmother   . Brain cancer Paternal Aunt     Social History   Tobacco Use  . Smoking status: Never Smoker  . Smokeless tobacco: Never Used  Vaping Use  . Vaping Use: Never used  Substance Use Topics  . Alcohol use: Yes    Alcohol/week: 0.0 standard drinks    Comment: rare  . Drug use: No    Home Medications Prior to Admission medications   Medication Sig Start Date End Date Taking? Authorizing Provider  azithromycin (ZITHROMAX) 250 MG tablet Please take 4 tablets PO once. 02/21/17   Liane Comber, NP    Allergies     Penicillins  Review of Systems   Review of Systems  All other systems reviewed and are negative.   Physical Exam Updated Vital Signs BP 112/78   Pulse (!) 108   Temp 98 F (36.7 C) (Oral)   Resp (!) 34   Wt 68 kg   LMP 04/24/2020   SpO2 93%   BMI 22.81 kg/m   Physical Exam Vitals and nursing note reviewed.  Constitutional:      General: She is not in acute distress.    Appearance: She is well-developed. She is ill-appearing.  HENT:     Head: Normocephalic.     Mouth/Throat:     Mouth: Mucous membranes are moist.  Eyes:     Pupils: Pupils are equal, round, and reactive to light.  Cardiovascular:     Rate and Rhythm: Regular rhythm. Tachycardia present.  Pulmonary:     Effort: Tachypnea present.     Breath sounds: Decreased breath sounds and wheezing present.  Musculoskeletal:        General: Normal range of motion.     Cervical back: Normal range of motion.     Right lower leg: No tenderness. No edema.     Left lower leg: No tenderness. No edema.  Skin:    General: Skin is warm and dry.  Capillary Refill: Capillary refill takes less than 2 seconds.  Neurological:     General: No focal deficit present.     Mental Status: She is alert.  Psychiatric:        Mood and Affect: Mood normal.     ED Results / Procedures / Treatments   Labs (all labs ordered are listed, but only abnormal results are displayed) Labs Reviewed  CBC - Abnormal; Notable for the following components:      Result Value   WBC 13.6 (*)    RBC 5.17 (*)    Hemoglobin 15.5 (*)    All other components within normal limits  D-DIMER, QUANTITATIVE - Abnormal; Notable for the following components:   D-Dimer, Quant 0.62 (*)    All other components within normal limits  RESP PANEL BY RT-PCR (FLU A&B, COVID) ARPGX2  BASIC METABOLIC PANEL  I-STAT BETA HCG BLOOD, ED ([facility], [facility], [facility] ONLY)  I-STAT BETA HCG BLOOD, ED ([facility], [facility], [facility] ONLY)  TROPONIN I (HIGH SENSITIVITY)  TROPONIN I (HIGH SENSITIVITY)     EKG None  Radiology DG Chest 2 View  Result Date: 05/22/2020 CLINICAL DATA:  Chest pain and shortness of breath for 2 days EXAM: CHEST - 2 VIEW COMPARISON:  None. FINDINGS: The heart size and mediastinal contours are within normal limits. Both lungs are clear. The visualized skeletal structures are unremarkable. IMPRESSION: No active cardiopulmonary disease. Electronically Signed   By: Inez Catalina M.D.   On: 05/22/2020 21:50    Procedures Procedures   Medications Ordered in ED Medications  albuterol (VENTOLIN HFA) 108 (90 Base) MCG/ACT inhaler 2 puff (has no administration in time range)  AeroChamber Plus Flo-Vu Medium MISC 1 each (has no administration in time range)  albuterol (PROVENTIL) (2.5 MG/3ML) 0.083% nebulizer solution 5 mg (5 mg Nebulization Given 05/22/20 2236)  methylPREDNISolone sodium succinate (SOLU-MEDROL) 125 mg/2 mL injection 125 mg (125 mg Intravenous Given 05/22/20 2234)  sodium chloride 0.9 % bolus 1,000 mL (1,000 mLs Intravenous New Bag/Given 05/22/20 2234)    ED Course  I have reviewed the triage vital signs and the nursing notes.  Pertinent labs & imaging results that were available during my care of the patient were reviewed by me and considered in my medical decision making (see chart for details).    MDM Rules/Calculators/A&P                         25 year old female presents today with respiratory distress and wheezing.  She received Solu-Medrol and albuterol 5 mg.  She has improved air movement and decreased wheezing.  Sats are stable at 94% currently.  Heart rate has been stable at 10 2-1 12. Plan repeat nebulizer and will give HFA. Will give outpatient course of steroids. Patient with decreased wheezing and good air movement.  Awaiting COVID test but otherwise appears stable for discharge  11:32 PM D-dimer negative and COVID test negative.  Patient appears stable for discharge Final Clinical Impression(s) / ED Diagnoses Final diagnoses:   Wheezing    Rx / DC Orders ED Discharge Orders    None       Pattricia Boss, MD 05/22/20 2322    Pattricia Boss, MD 05/22/20 2332

## 2020-05-22 NOTE — Discharge Instructions (Addendum)
Please take medications as prescribed Use your albuterol inhaler 2 puffs every 4 hours Please find primary care doctor and establish care Do not smoke or use any inhalants other than medications

## 2020-05-22 NOTE — ED Triage Notes (Signed)
Pt reports waking up severely shob and having chest pains. Pt admits nonproductive cough. Transported to XR and returns with o2 at 88% ra.

## 2020-05-23 ENCOUNTER — Ambulatory Visit: Payer: Self-pay

## 2020-05-27 ENCOUNTER — Other Ambulatory Visit: Payer: Self-pay

## 2020-05-27 ENCOUNTER — Encounter: Payer: Self-pay | Admitting: Nurse Practitioner

## 2020-05-27 ENCOUNTER — Ambulatory Visit (INDEPENDENT_AMBULATORY_CARE_PROVIDER_SITE_OTHER): Payer: 59 | Admitting: Nurse Practitioner

## 2020-05-27 VITALS — BP 110/62 | HR 89 | Temp 98.0°F | Ht 67.8 in | Wt 139.2 lb

## 2020-05-27 DIAGNOSIS — Z1159 Encounter for screening for other viral diseases: Secondary | ICD-10-CM

## 2020-05-27 DIAGNOSIS — L309 Dermatitis, unspecified: Secondary | ICD-10-CM | POA: Diagnosis not present

## 2020-05-27 DIAGNOSIS — R590 Localized enlarged lymph nodes: Secondary | ICD-10-CM

## 2020-05-27 DIAGNOSIS — R059 Cough, unspecified: Secondary | ICD-10-CM

## 2020-05-27 DIAGNOSIS — H6123 Impacted cerumen, bilateral: Secondary | ICD-10-CM | POA: Diagnosis not present

## 2020-05-27 DIAGNOSIS — E559 Vitamin D deficiency, unspecified: Secondary | ICD-10-CM | POA: Diagnosis not present

## 2020-05-27 NOTE — Progress Notes (Addendum)
I,Yamilka Roman [employer] as a Education administrator for [employer], FNP.,have documented all relevant documentation on the behalf of Minette Brine, FNP,as directed by  Minette Brine, FNP while in the presence of Minette Brine, [place].  This visit occurred during the SARS-CoV-2 public health emergency.  Safety protocols were in place, including screening questions prior to the visit, additional usage of staff PPE, and extensive cleaning of exam room while observing appropriate contact time as indicated for disinfecting solutions.  Subjective:     Patient ID: Annette Zuniga , female    DOB: 1996/01/07 , 25 y.o.   MRN: [id number]   Chief Complaint  Patient presents with   Establish Care   swollen lymph node    Patient stated she has had a swollen lymph node for the past 2 years and its never bothered her until now    HPI  Patient presents today to establish primary care. She had been seeing Dr. Melford Aase (she thinks the another provider there but is unusre of the name).  She currently not working or in school. Single. No children. She is originally from [place] and relocated here at age 10. She is unsure if she had her HPV. She has never had a PAP.   PMH - left breast had mass removed (2018) - benign.  Eczema - uses over the counter creams, was worse as a child.   [facility] - 3 sisters (1 sister with scoliosis) and one brother - all healthy.    Patient stated she has had a swollen lymph node for the past 2 years and its never bothered her until now. She stated she did go to the ER a few days ago because she was trouble breathing. She said they did an ekg on her and it came back abnormal and she said they gave her some inhalers as well so she isnt sure if it is asthma or not. Reports history of chronic sinusitis. She was seen in the ER 2 days ago after having problems breathing and was having trouble laying down. Was having difficulty with breathing.     Past Medical History:  Diagnosis Date   Breast mass     left   Eczema      Family History  Problem Relation Age of Onset   Asthma Father    Brain cancer Paternal Grandmother    Brain cancer Paternal Aunt      Current Outpatient Medications:    predniSONE (DELTASONE) 10 MG tablet, Take 2 tablets (20 mg total) by mouth daily., Disp: 10 tablet, Rfl: 0   azithromycin (ZITHROMAX) 250 MG tablet, Please take 4 tablets PO once. (Patient not taking: Reported on 05/27/2020), Disp: 4 tablet, Rfl: 0   Allergies  Allergen Reactions   Penicillins Rash    .Marland KitchenHas patient had a PCN reaction causing immediate rash, facial/tongue/throat swelling, SOB or lightheadedness with hypotension: NO Has patient had a PCN reaction causing severe rash involving mucus membranes or skin necrosis: NO Has patient had a PCN reaction occurring within the last 10 years: No If all of the above answers are "NO", then may proceed with Cephalosporin use.      Review of Systems  Constitutional: Negative.   Respiratory: Positive for cough (slight phlegm) and shortness of breath (2 days ago has improved since taking a steroid and azithromycin).   Cardiovascular: Negative.  Negative for chest pain, palpitations and leg swelling.  Neurological: Negative for dizziness, light-headedness and headaches.  Psychiatric/Behavioral: Negative.      Today's Vitals  05/27/20 0903  BP: 110/62  Pulse: 89  Temp: 98 F (36.7 C)  TempSrc: Oral  Weight: 139 lb 3.2 oz (63.1 kg)  Height: 5' 7.8" (1.722 m)  PainSc: 0-No pain   Body mass index is 21.29 kg/m.   Objective:  Physical Exam Vitals reviewed.  Constitutional:      General: She is not in acute distress.    Appearance: Normal appearance.  HENT:     Right Ear: External ear normal. There is impacted cerumen.     Left Ear: External ear normal. There is impacted cerumen.     Nose: Nose normal.  Eyes:     Pupils: Pupils are equal, round, and reactive to light.  Neck:     Comments: Submandibular lymphadenopathy, non  tender Cardiovascular:     Rate and Rhythm: Normal rate and regular rhythm.     Pulses: Normal pulses.     Heart sounds: Normal heart sounds. No murmur heard.   Pulmonary:     Effort: Pulmonary effort is normal. No respiratory distress.     Breath sounds: Normal breath sounds. No wheezing.  Neurological:     General: No focal deficit present.     Mental Status: She is alert and oriented to person, place, and time.     Cranial Nerves: No cranial nerve deficit.     Motor: No weakness.  Psychiatric:        Mood and Affect: Mood normal.        Behavior: Behavior normal.        Thought Content: Thought content normal.        Judgment: Judgment normal.         Assessment And Plan:     1. Cough She is encouraged to take an antihistamine this may be related to postnasal drainage Continue medications given at ER  2. Vitamin D deficiency Previous history of low vitamin d  3. Lymphadenopathy, submandibular She had an elevated d dimer at the ER, will repeat to ensure is resolving Will check HIV her CBC  Was slightly elevated she was treated with antibiotics - D-dimer, quantitative - HIV antibody (with reflex)  4. Excessive cerumen in both ear canals Removed cerumen with lighted curette  5. Encounter for hepatitis C screening test for low risk patient Will check Hepatitis C screening due to recent recommendations to screen all adults 18 years and older - Hepatitis C antibody     Patient was given opportunity to ask questions. Patient verbalized understanding of the plan and was able to repeat key elements of the plan. All questions were answered to their satisfaction.  Minette Brine, FNP   I, Minette Brine, FNP, have reviewed all documentation for this visit. The documentation on 05/27/20 for the exam, diagnosis, procedures, and orders are all accurate and complete.   IF YOU HAVE BEEN REFERRED TO A SPECIALIST, IT MAY TAKE 1-2 WEEKS TO SCHEDULE/PROCESS THE REFERRAL. IF YOU HAVE  NOT HEARD FROM US/SPECIALIST IN TWO WEEKS, PLEASE GIVE [place] A CALL AT [phone].   THE PATIENT IS ENCOURAGED TO PRACTICE SOCIAL DISTANCING DUE TO THE COVID-19 PANDEMIC.

## 2020-05-28 LAB — HIV ANTIBODY (ROUTINE TESTING W REFLEX): HIV Screen 4th Generation wRfx: NONREACTIVE

## 2020-05-28 LAB — D-DIMER, QUANTITATIVE: D-DIMER: <0.2 mg/L FEU (ref 0.00–0.49)

## 2020-05-28 LAB — HEPATITIS C ANTIBODY: Hep C Virus Ab: <0.1 s/co ratio (ref 0.0–0.9)

## 2020-08-27 ENCOUNTER — Encounter: Payer: 59 | Admitting: Nurse Practitioner

## 2020-10-17 ENCOUNTER — Encounter: Payer: 59 | Admitting: Nurse Practitioner

## 2022-04-14 IMAGING — CR DG CHEST 2V
2 series · 2 of 2 positions shown · non-contrast
Comparison: None.

CLINICAL DATA: Chest pain and shortness of breath for 2 days

EXAM:
CHEST - 2 VIEW

[w chest lat]
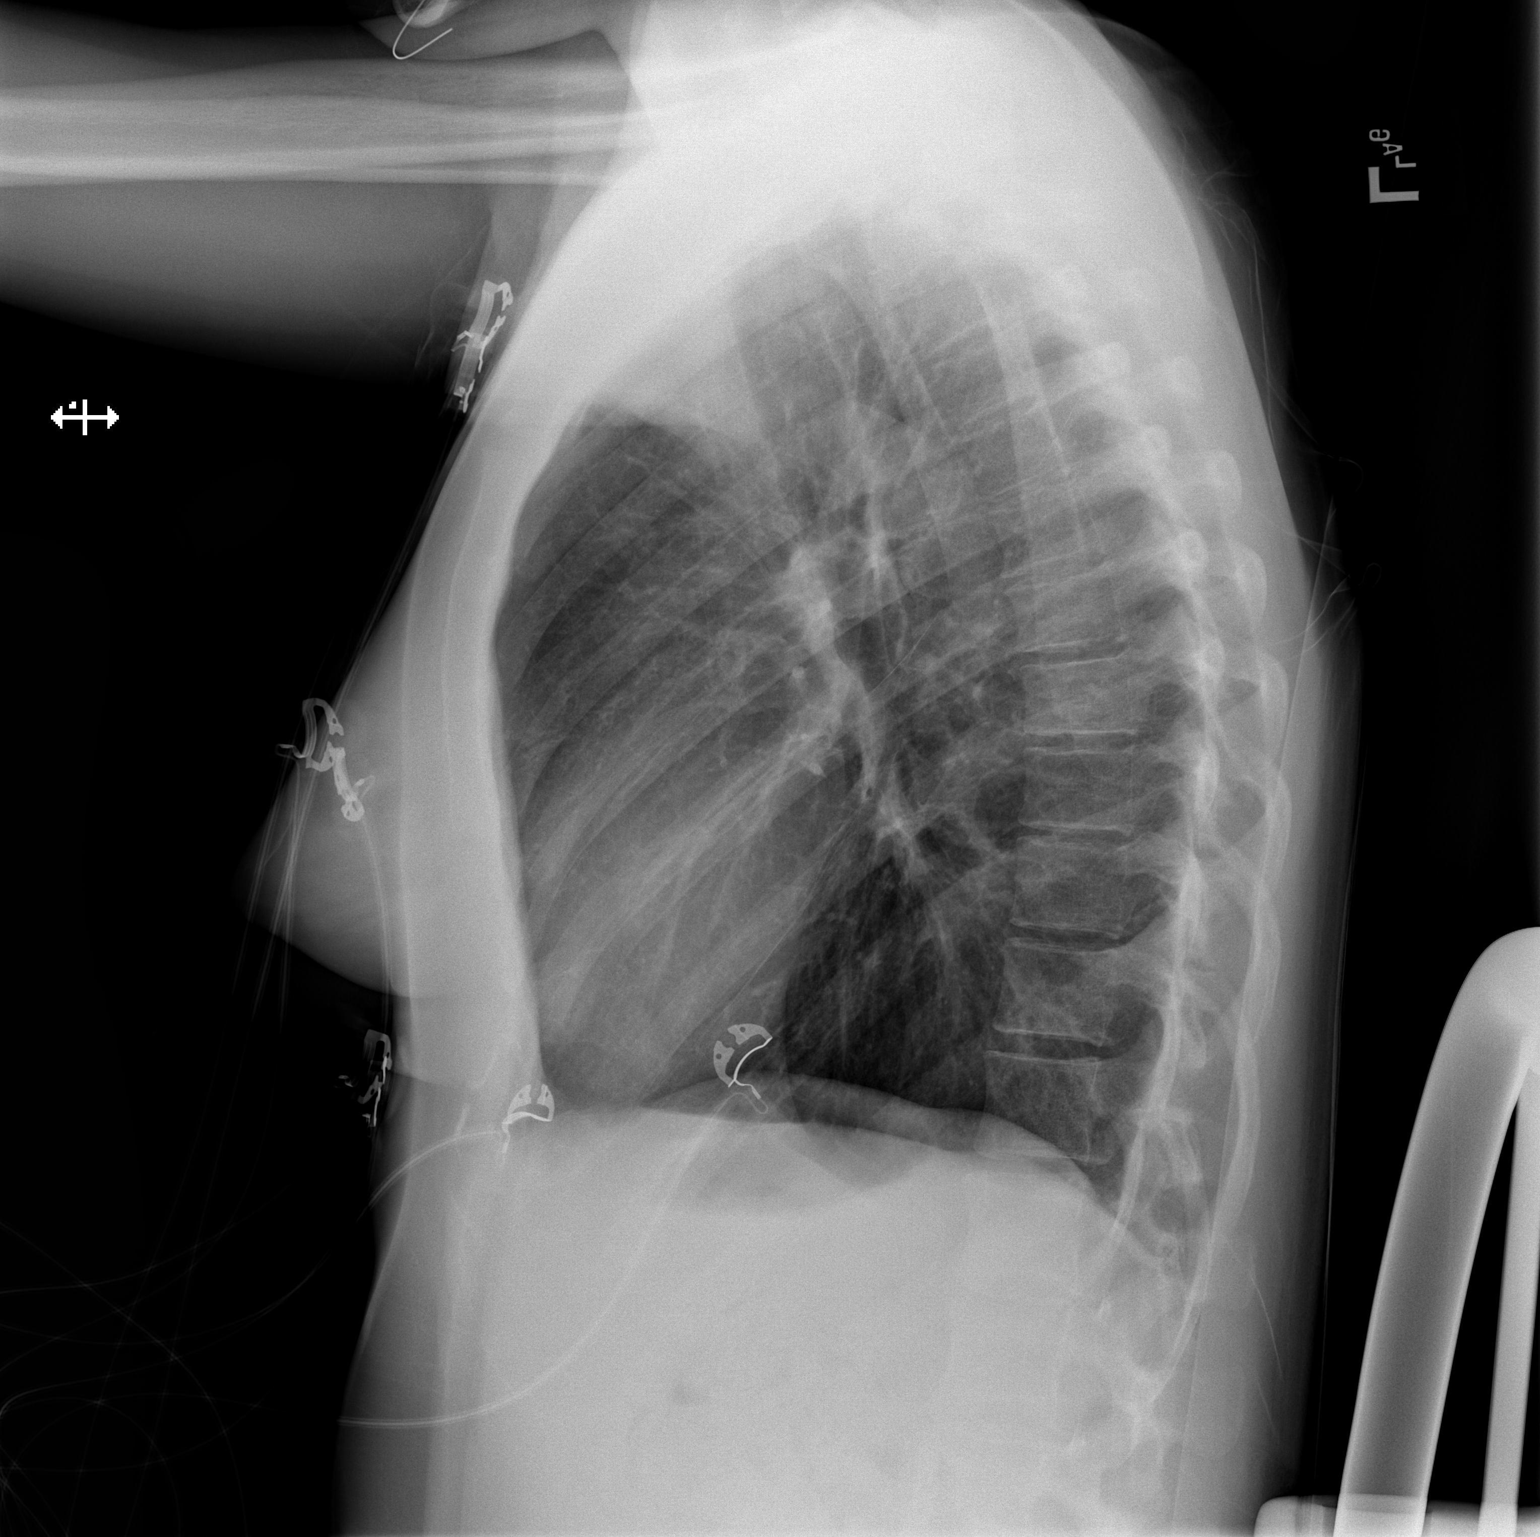

[x chest ap]
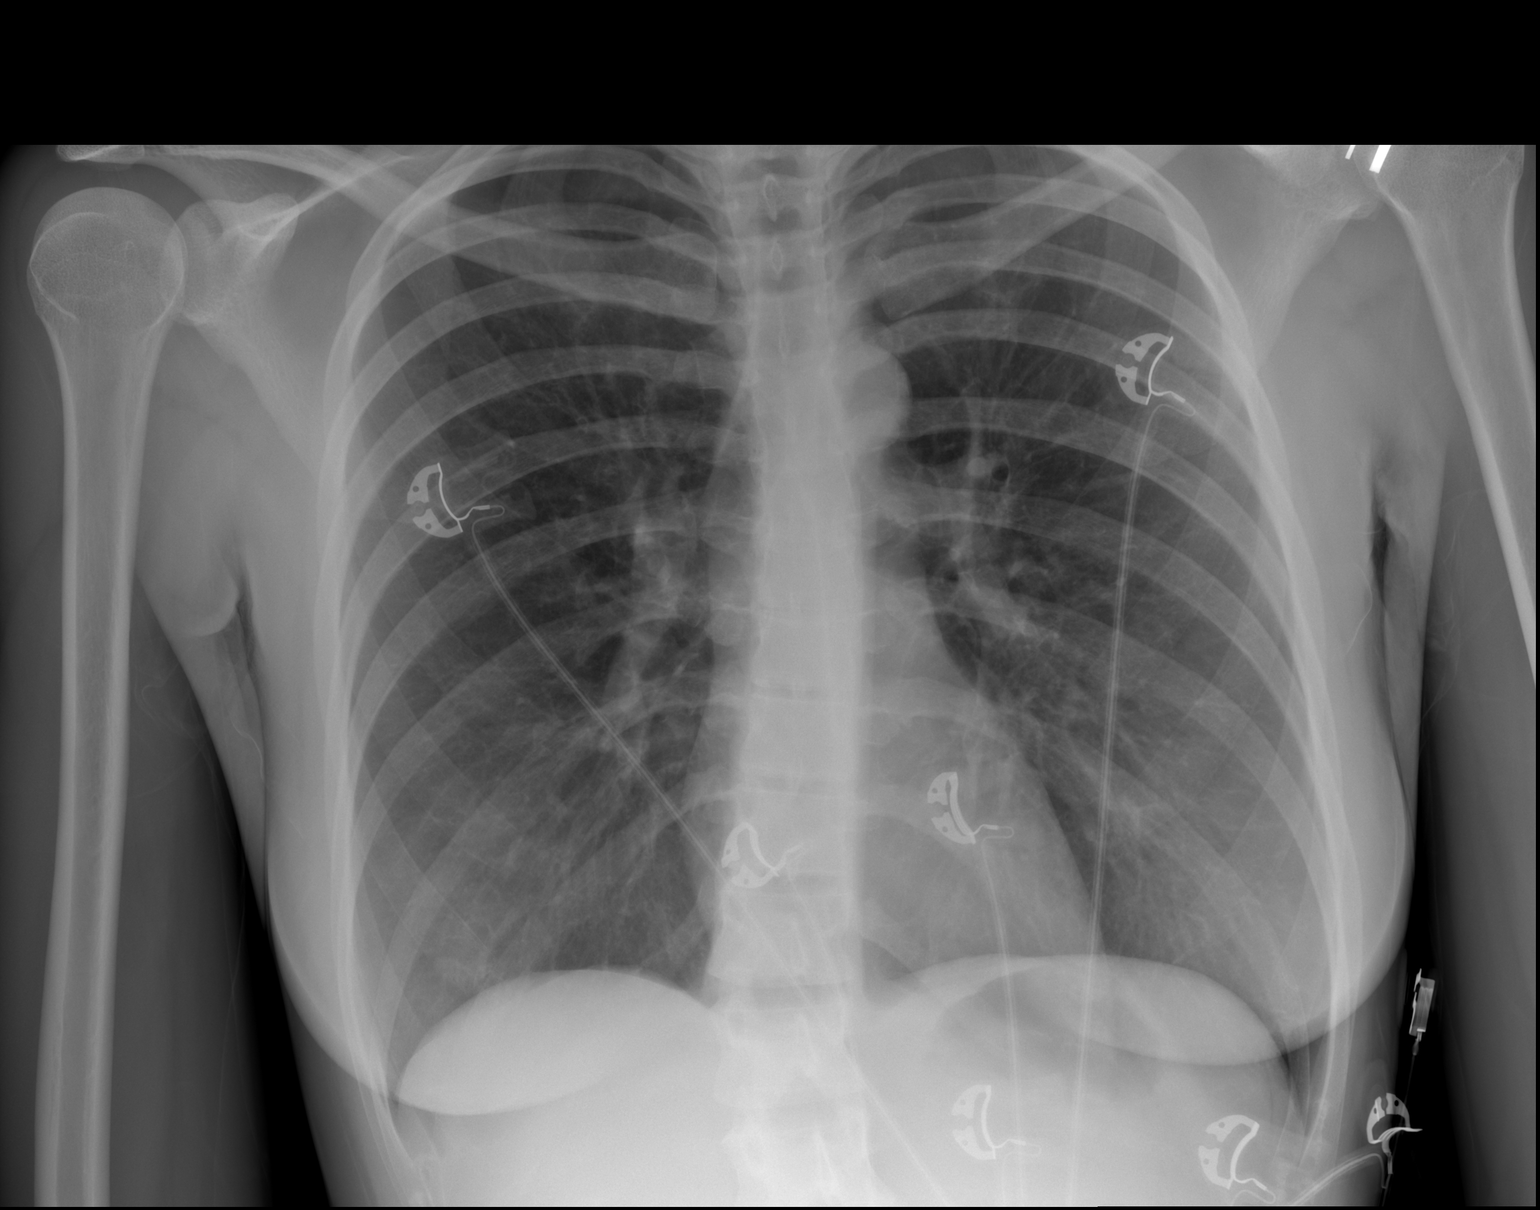

[2 of 2 positions shown; findings below may reference images not displayed]

FINDINGS: The heart size and mediastinal contours are within normal limits.
Both lungs are clear. The visualized skeletal structures are
unremarkable.
IMPRESSION: No active cardiopulmonary disease.

## 2022-06-23 ENCOUNTER — Ambulatory Visit (INDEPENDENT_AMBULATORY_CARE_PROVIDER_SITE_OTHER): Payer: 59 | Admitting: Family Medicine

## 2022-06-23 ENCOUNTER — Encounter: Payer: Self-pay | Admitting: Family Medicine

## 2022-06-23 VITALS — BP 102/70 | HR 67 | Temp 98.2°F | Resp 14 | Ht 68.5 in | Wt 162.4 lb

## 2022-06-23 DIAGNOSIS — J3089 Other allergic rhinitis: Secondary | ICD-10-CM

## 2022-06-23 DIAGNOSIS — J4530 Mild persistent asthma, uncomplicated: Secondary | ICD-10-CM | POA: Diagnosis not present

## 2022-06-23 DIAGNOSIS — R600 Localized edema: Secondary | ICD-10-CM

## 2022-06-23 DIAGNOSIS — Z7289 Other problems related to lifestyle: Secondary | ICD-10-CM | POA: Diagnosis not present

## 2022-06-23 MED ORDER — ASMANEX HFA 100 MCG/ACT IN AERO: 1.0000 | INHALATION_SPRAY | Freq: Every day | RESPIRATORY_TRACT | 1 refills | Status: DC

## 2022-06-23 MED ORDER — ALBUTEROL SULFATE HFA 108 (90 BASE) MCG/ACT IN AERS: 2.0000 | INHALATION_SPRAY | Freq: Four times a day (QID) | RESPIRATORY_TRACT | 0 refills | Status: DC | PRN

## 2022-06-23 NOTE — Patient Instructions (Signed)
Use either Flonase or Rhinocort nasal spray regularly .  Use a Nettie pot before you spray anything in there.  Also take either Claritin or Allegra for your sneezing, itchy watery eyes runny nose

## 2022-06-23 NOTE — Progress Notes (Signed)
   Subjective:    Patient ID: Annette Zuniga, female    DOB: 08-14-1995, 27 y.o.   MRN: [id number]  HPI She is here for consult concerning lesion in the right submandibular area that has been there for approximately 4 years.  She does have an underlying history of allergies with sneezing, itchy watery eyes, rhinorrhea and nasal congestion but presently is not on any medications regularly.  She also states that she has asthma especially having allergies to cats.  She has been using an albuterol inhaler 4-5 times per day given to her by her father.  She does vape.   Review of Systems     Objective:   Physical Exam Alert and in no distress. Tympanic membranes and canals are normal. Pharyngeal area is normal. Neck is supple without adenopathy or thyromegaly. Cardiac exam shows a regular sinus rhythm without murmurs or gallops. Lungs are clear to auscultation.        Assessment & Plan:  Submandibular gland swelling - Plan: Ambulatory referral to ENT  Non-seasonal allergic rhinitis, unspecified trigger - Plan: CBC with Differential/Platelet, Comprehensive metabolic panel  Mild persistent asthma, unspecified whether complicated - Plan: Mometasone Furoate (ASMANEX HFA) 100 MCG/ACT AERO, albuterol (VENTOLIN HFA) 108 (90 Base) MCG/ACT inhaler  Current every day vaping I explained that she does indeed have asthma and is not adequately controlled.  Explained the need for a steroid inhaler and a rescue inhaler .  Also recommend steroid nasal spray of either Flonase or Rhinocort.  Also to use either Claritin or Allegra.  She is then to follow-up with me in approximately 3 to 4 weeks.

## 2022-06-24 LAB — CBC WITH DIFFERENTIAL/PLATELET

## 2022-06-25 LAB — CBC WITH DIFFERENTIAL/PLATELET
Basophils Absolute: 0 10*3/uL (ref 0.0–0.2)
Basos: 0 %
EOS (ABSOLUTE): 1.4 10*3/uL — ABNORMAL HIGH (ref 0.0–0.4)
Eos: 21 %
Hematocrit: 42.6 % (ref 34.0–46.6)
Hemoglobin: 14 g/dL (ref 11.1–15.9)
Immature Grans (Abs): 0 10*3/uL (ref 0.0–0.1)
Immature Granulocytes: 0 %
Lymphocytes Absolute: 1.6 10*3/uL (ref 0.7–3.1)
Lymphs: 24 %
MCH: 28.8 pg (ref 26.6–33.0)
MCHC: 32.9 g/dL (ref 31.5–35.7)
MCV: 88 fL (ref 79–97)
Monocytes Absolute: 0.3 10*3/uL (ref 0.1–0.9)
Monocytes: 5 %
Neutrophils Absolute: 3.4 10*3/uL (ref 1.4–7.0)
Neutrophils: 50 %
Platelets: 256 10*3/uL (ref 150–450)
RBC: 4.86 x10E6/uL (ref 3.77–5.28)
RDW: 12.1 % (ref 11.7–15.4)
WBC: 6.9 10*3/uL (ref 3.4–10.8)

## 2022-06-25 LAB — COMPREHENSIVE METABOLIC PANEL
ALT: 15 IU/L (ref 0–32)
AST: 15 IU/L (ref 0–40)
Albumin/Globulin Ratio: 1.5
Albumin: 4.6 g/dL (ref 4.0–5.0)
Alkaline Phosphatase: 60 IU/L (ref 44–121)
BUN/Creatinine Ratio: 18 (ref 9–23)
BUN: 11 mg/dL (ref 6–20)
Bilirubin Total: 0.8 mg/dL (ref 0.0–1.2)
CO2: 17 mmol/L — ABNORMAL LOW (ref 20–29)
Calcium: 9.1 mg/dL (ref 8.7–10.2)
Chloride: 104 mmol/L (ref 96–106)
Creatinine, Ser: 0.6 mg/dL (ref 0.57–1.00)
Globulin, Total: 3 g/dL (ref 1.5–4.5)
Glucose: 90 mg/dL (ref 70–99)
Potassium: 4.5 mmol/L (ref 3.5–5.2)
Sodium: 137 mmol/L (ref 134–144)
Total Protein: 7.6 g/dL (ref 6.0–8.5)
eGFR: 126 mL/min/{1.73_m2} (ref >59)

## 2022-07-19 ENCOUNTER — Other Ambulatory Visit: Payer: Self-pay | Admitting: Family Medicine

## 2022-07-19 DIAGNOSIS — J4530 Mild persistent asthma, uncomplicated: Secondary | ICD-10-CM

## 2022-07-20 NOTE — Telephone Encounter (Signed)
Refill request last apt 06/23/22.

## 2022-07-21 ENCOUNTER — Ambulatory Visit: Payer: 59 | Admitting: Family Medicine

## 2022-07-24 ENCOUNTER — Encounter: Payer: Self-pay | Admitting: Family Medicine

## 2022-08-04 ENCOUNTER — Encounter: Payer: Self-pay | Admitting: Internal Medicine

## 2022-08-04 ENCOUNTER — Encounter: Payer: Self-pay | Admitting: Family Medicine

## 2022-08-04 ENCOUNTER — Ambulatory Visit (INDEPENDENT_AMBULATORY_CARE_PROVIDER_SITE_OTHER): Payer: BLUE CROSS/BLUE SHIELD | Admitting: Family Medicine

## 2022-08-04 ENCOUNTER — Other Ambulatory Visit: Payer: Self-pay | Admitting: Internal Medicine

## 2022-08-04 VITALS — BP 106/66 | HR 72 | Temp 98.3°F | Resp 16 | Wt 168.2 lb

## 2022-08-04 DIAGNOSIS — R600 Localized edema: Secondary | ICD-10-CM

## 2022-08-04 DIAGNOSIS — R221 Localized swelling, mass and lump, neck: Secondary | ICD-10-CM

## 2022-08-04 DIAGNOSIS — J4530 Mild persistent asthma, uncomplicated: Secondary | ICD-10-CM

## 2022-08-04 MED ORDER — ASMANEX HFA 100 MCG/ACT IN AERO: 1.0000 | INHALATION_SPRAY | Freq: Every day | RESPIRATORY_TRACT | 3 refills | Status: AC

## 2022-08-04 NOTE — Progress Notes (Signed)
   Subjective:    Patient ID: Annette Zuniga, female    DOB: [birth year], 27 y.o.   MRN: [id number]  HPI She is here for recheck.  She is not using Asmanex and states that after about 3 to 4 days she has been breathing normally and not requiring rescue inhaler. Also of note is that she was seen by another physician for the submandibular gland issue and apparently a CT scan is in the process of being ordered.  She also has an appointment set up to be seen by ENT in September.   Review of Systems     Objective:    Physical Exam Alert and in no distress.  Lungs are clear to auscultation.  Exam of the neck does show a 2 to 3 cm round smooth movable lesion in the right submandibular area.       Assessment & Plan:   Problem List Items Addressed This Visit     Mild persistent asthma - Primary   Relevant Medications   Mometasone Furoate (ASMANEX HFA) 100 MCG/ACT AERO   Submandibular gland swelling  I discussed the fact that a CT scan will probably be ordered to assess the submandibular lesion.  She can then discuss this with ENT as she already has an appointment scheduled. Also recommend that she check with [facility] concerning seeing a physician that is in the network as coming to me is not covered

## 2022-08-04 NOTE — Patient Instructions (Signed)
If you have to use your rescue inhaler more than twice per week during the day or twice per month at night you are not under good control

## 2022-08-06 ENCOUNTER — Other Ambulatory Visit: Payer: 59

## 2022-09-07 ENCOUNTER — Other Ambulatory Visit: Payer: Self-pay | Admitting: Family Medicine

## 2022-09-07 DIAGNOSIS — J4530 Mild persistent asthma, uncomplicated: Secondary | ICD-10-CM

## 2022-09-07 MED ORDER — ALBUTEROL SULFATE HFA 108 (90 BASE) MCG/ACT IN AERS: 2.0000 | INHALATION_SPRAY | Freq: Four times a day (QID) | RESPIRATORY_TRACT | 0 refills | Status: DC | PRN

## 2022-09-30 ENCOUNTER — Other Ambulatory Visit: Payer: Self-pay | Admitting: Family Medicine

## 2022-09-30 DIAGNOSIS — J4530 Mild persistent asthma, uncomplicated: Secondary | ICD-10-CM

## 2022-12-07 ENCOUNTER — Encounter (HOSPITAL_BASED_OUTPATIENT_CLINIC_OR_DEPARTMENT_OTHER): Payer: Self-pay

## 2022-12-07 ENCOUNTER — Ambulatory Visit (HOSPITAL_COMMUNITY): Admit: 2022-12-07 | Payer: BLUE CROSS/BLUE SHIELD | Admitting: Otolaryngology

## 2022-12-07 SURGERY — EXCISION SUBMANDIBULAR GLAND
Anesthesia: General | Laterality: Right

## 2023-03-09 ENCOUNTER — Encounter: Payer: Self-pay | Admitting: Internal Medicine
